# Patient Record
Sex: Female | Born: 1960 | Race: White | Hispanic: No | Smoking: Never smoker
Health system: Southern US, Community
[De-identification: ages and names within clinical notes are randomized; demographics above are authoritative.]

## PROBLEM LIST (undated history)

## (undated) DIAGNOSIS — E559 Vitamin D deficiency, unspecified: Secondary | ICD-10-CM

## (undated) DIAGNOSIS — K8020 Calculus of gallbladder without cholecystitis without obstruction: Secondary | ICD-10-CM

## (undated) DIAGNOSIS — N200 Calculus of kidney: Secondary | ICD-10-CM

## (undated) DIAGNOSIS — E119 Type 2 diabetes mellitus without complications: Secondary | ICD-10-CM

## (undated) DIAGNOSIS — T8859XA Other complications of anesthesia, initial encounter: Secondary | ICD-10-CM

## (undated) DIAGNOSIS — Z9289 Personal history of other medical treatment: Secondary | ICD-10-CM

## (undated) DIAGNOSIS — Z9889 Other specified postprocedural states: Secondary | ICD-10-CM

## (undated) DIAGNOSIS — L732 Hidradenitis suppurativa: Secondary | ICD-10-CM

## (undated) DIAGNOSIS — K766 Portal hypertension: Secondary | ICD-10-CM

## (undated) DIAGNOSIS — Z8619 Personal history of other infectious and parasitic diseases: Secondary | ICD-10-CM

## (undated) DIAGNOSIS — K7460 Unspecified cirrhosis of liver: Secondary | ICD-10-CM

## (undated) DIAGNOSIS — K219 Gastro-esophageal reflux disease without esophagitis: Secondary | ICD-10-CM

## (undated) DIAGNOSIS — G4730 Sleep apnea, unspecified: Secondary | ICD-10-CM

## (undated) DIAGNOSIS — N1832 Chronic kidney disease, stage 3b: Secondary | ICD-10-CM

## (undated) DIAGNOSIS — D649 Anemia, unspecified: Secondary | ICD-10-CM

## (undated) DIAGNOSIS — I700 Atherosclerosis of aorta: Secondary | ICD-10-CM

## (undated) DIAGNOSIS — F32A Depression, unspecified: Secondary | ICD-10-CM

## (undated) DIAGNOSIS — I10 Essential (primary) hypertension: Secondary | ICD-10-CM

## (undated) HISTORY — PX: COLONOSCOPY: SHX174

## (undated) HISTORY — PX: KIDNEY STONE SURGERY: SHX686

## (undated) HISTORY — PX: INGUINAL HIDRADENITIS EXCISION: SHX1827

## (undated) HISTORY — PX: TONSILLECTOMY: SUR1361

## (undated) HISTORY — PX: LAPAROSCOPIC GASTRIC SLEEVE RESECTION: SHX5895

---

## 1999-10-12 ENCOUNTER — Ambulatory Visit (HOSPITAL_COMMUNITY): Admission: RE | Admit: 1999-10-12 | Discharge: 1999-10-13 | Payer: Self-pay | Admitting: Otolaryngology

## 2005-06-29 ENCOUNTER — Emergency Department: Payer: Self-pay | Admitting: Emergency Medicine

## 2006-04-10 ENCOUNTER — Emergency Department: Payer: Self-pay

## 2007-01-25 ENCOUNTER — Ambulatory Visit: Payer: Self-pay | Admitting: Cardiovascular Disease

## 2009-05-20 ENCOUNTER — Ambulatory Visit: Payer: Self-pay | Admitting: Gastroenterology

## 2009-07-10 ENCOUNTER — Ambulatory Visit: Payer: Self-pay | Admitting: Internal Medicine

## 2009-08-18 ENCOUNTER — Ambulatory Visit: Payer: Self-pay | Admitting: Internal Medicine

## 2009-09-03 ENCOUNTER — Ambulatory Visit: Payer: Self-pay | Admitting: Internal Medicine

## 2009-09-21 ENCOUNTER — Ambulatory Visit: Payer: Self-pay | Admitting: Internal Medicine

## 2010-07-29 ENCOUNTER — Encounter: Payer: Self-pay | Admitting: Nurse Practitioner

## 2010-08-05 ENCOUNTER — Ambulatory Visit: Payer: Self-pay | Admitting: Internal Medicine

## 2010-08-11 ENCOUNTER — Ambulatory Visit: Payer: Self-pay | Admitting: Internal Medicine

## 2010-08-22 ENCOUNTER — Ambulatory Visit: Payer: Self-pay | Admitting: Internal Medicine

## 2010-10-26 ENCOUNTER — Ambulatory Visit: Payer: Self-pay | Admitting: Internal Medicine

## 2010-11-05 ENCOUNTER — Encounter: Payer: Self-pay | Admitting: Internal Medicine

## 2010-12-05 ENCOUNTER — Emergency Department: Payer: Self-pay | Admitting: Internal Medicine

## 2010-12-25 ENCOUNTER — Emergency Department: Payer: Self-pay | Admitting: Internal Medicine

## 2011-07-07 ENCOUNTER — Emergency Department: Payer: Self-pay | Admitting: Emergency Medicine

## 2011-07-07 LAB — URINALYSIS, COMPLETE
Glucose,UR: NEGATIVE mg/dL (ref 0–75)
Hyaline Cast: 5
Ph: 5 (ref 4.5–8.0)
WBC UR: 133 /HPF (ref 0–5)

## 2011-07-07 LAB — BASIC METABOLIC PANEL
Anion Gap: 6 — ABNORMAL LOW (ref 7–16)
Creatinine: 1.1 mg/dL (ref 0.60–1.30)
EGFR (Non-African Amer.): 58 — ABNORMAL LOW
Osmolality: 282 (ref 275–301)
Potassium: 4.5 mmol/L (ref 3.5–5.1)

## 2011-07-07 LAB — CBC
HCT: 28.7 % — ABNORMAL LOW (ref 35.0–47.0)
HGB: 8.6 g/dL — ABNORMAL LOW (ref 12.0–16.0)
MCH: 21 pg — ABNORMAL LOW (ref 26.0–34.0)
MCHC: 30 g/dL — ABNORMAL LOW (ref 32.0–36.0)

## 2011-08-05 ENCOUNTER — Ambulatory Visit: Payer: Self-pay | Admitting: Internal Medicine

## 2011-11-11 ENCOUNTER — Ambulatory Visit: Payer: Self-pay | Admitting: Internal Medicine

## 2011-12-15 ENCOUNTER — Ambulatory Visit: Payer: Self-pay | Admitting: Internal Medicine

## 2012-02-22 HISTORY — PX: REDUCTION MAMMAPLASTY: SUR839

## 2013-03-16 ENCOUNTER — Emergency Department: Payer: Self-pay | Admitting: Internal Medicine

## 2013-03-16 LAB — BASIC METABOLIC PANEL
ANION GAP: 4 — AB (ref 7–16)
BUN: 17 mg/dL (ref 7–18)
CO2: 26 mmol/L (ref 21–32)
CREATININE: 0.94 mg/dL (ref 0.60–1.30)
Calcium, Total: 9.2 mg/dL (ref 8.5–10.1)
Chloride: 103 mmol/L (ref 98–107)
EGFR (African American): >60
EGFR (Non-African Amer.): >60
GLUCOSE: 179 mg/dL — AB (ref 65–99)
Osmolality: 272 (ref 275–301)
Potassium: 4.6 mmol/L (ref 3.5–5.1)
SODIUM: 133 mmol/L — AB (ref 136–145)

## 2013-03-16 LAB — CBC
HCT: 38.9 % (ref 35.0–47.0)
HGB: 12.6 g/dL (ref 12.0–16.0)
MCH: 27.1 pg (ref 26.0–34.0)
MCHC: 32.4 g/dL (ref 32.0–36.0)
MCV: 84 fL (ref 80–100)
Platelet: 338 10*3/uL (ref 150–440)
RBC: 4.66 10*6/uL (ref 3.80–5.20)
RDW: 17.4 % — ABNORMAL HIGH (ref 11.5–14.5)
WBC: 9.5 10*3/uL (ref 3.6–11.0)

## 2013-03-16 LAB — TROPONIN I

## 2013-03-19 LAB — WOUND AEROBIC CULTURE

## 2014-07-29 ENCOUNTER — Other Ambulatory Visit: Payer: Self-pay | Admitting: Family Medicine

## 2014-07-29 DIAGNOSIS — Z1231 Encounter for screening mammogram for malignant neoplasm of breast: Secondary | ICD-10-CM

## 2014-08-04 ENCOUNTER — Ambulatory Visit
Admission: RE | Admit: 2014-08-04 | Discharge: 2014-08-04 | Disposition: A | Discharge status: Home / Self Care | Payer: Medicare Other | Source: Ambulatory Visit | Attending: Family Medicine | Admitting: Family Medicine

## 2014-08-04 DIAGNOSIS — R928 Other abnormal and inconclusive findings on diagnostic imaging of breast: Secondary | ICD-10-CM | POA: Insufficient documentation

## 2014-08-04 DIAGNOSIS — Z1231 Encounter for screening mammogram for malignant neoplasm of breast: Secondary | ICD-10-CM | POA: Insufficient documentation

## 2014-08-08 ENCOUNTER — Other Ambulatory Visit: Payer: Self-pay | Admitting: Family Medicine

## 2014-08-08 DIAGNOSIS — R928 Other abnormal and inconclusive findings on diagnostic imaging of breast: Secondary | ICD-10-CM

## 2014-09-23 ENCOUNTER — Other Ambulatory Visit: Payer: Self-pay | Admitting: Oncology

## 2014-09-23 ENCOUNTER — Ambulatory Visit
Admission: RE | Admit: 2014-09-23 | Discharge: 2014-09-23 | Disposition: A | Discharge status: Home / Self Care | Payer: Medicare Other | Source: Ambulatory Visit | Attending: Family Medicine | Admitting: Family Medicine

## 2014-09-23 DIAGNOSIS — N63 Unspecified lump in breast: Secondary | ICD-10-CM | POA: Diagnosis present

## 2014-09-23 DIAGNOSIS — R928 Other abnormal and inconclusive findings on diagnostic imaging of breast: Secondary | ICD-10-CM

## 2014-09-25 ENCOUNTER — Other Ambulatory Visit: Payer: Self-pay | Admitting: Family Medicine

## 2014-09-25 DIAGNOSIS — N63 Unspecified lump in unspecified breast: Secondary | ICD-10-CM

## 2014-09-25 DIAGNOSIS — R928 Other abnormal and inconclusive findings on diagnostic imaging of breast: Secondary | ICD-10-CM

## 2014-10-01 ENCOUNTER — Ambulatory Visit
Admission: RE | Admit: 2014-10-01 | Discharge: 2014-10-01 | Disposition: A | Discharge status: Home / Self Care | Payer: Medicare Other | Source: Ambulatory Visit | Attending: Family Medicine | Admitting: Family Medicine

## 2014-10-01 DIAGNOSIS — R928 Other abnormal and inconclusive findings on diagnostic imaging of breast: Secondary | ICD-10-CM | POA: Diagnosis present

## 2014-10-01 DIAGNOSIS — Z9889 Other specified postprocedural states: Secondary | ICD-10-CM | POA: Insufficient documentation

## 2014-10-01 DIAGNOSIS — N63 Unspecified lump in unspecified breast: Secondary | ICD-10-CM

## 2014-10-01 HISTORY — PX: BREAST BIOPSY: SHX20

## 2014-10-02 LAB — SURGICAL PATHOLOGY

## 2015-06-26 ENCOUNTER — Other Ambulatory Visit: Payer: Self-pay | Admitting: Surgical Oncology

## 2015-06-26 DIAGNOSIS — G4733 Obstructive sleep apnea (adult) (pediatric): Secondary | ICD-10-CM

## 2015-06-26 DIAGNOSIS — I10 Essential (primary) hypertension: Secondary | ICD-10-CM

## 2015-06-26 DIAGNOSIS — K219 Gastro-esophageal reflux disease without esophagitis: Secondary | ICD-10-CM

## 2015-07-07 ENCOUNTER — Other Ambulatory Visit: Payer: Medicare Other

## 2015-07-08 ENCOUNTER — Other Ambulatory Visit: Payer: Self-pay | Admitting: Surgical Oncology

## 2015-07-08 ENCOUNTER — Ambulatory Visit
Admission: RE | Admit: 2015-07-08 | Discharge: 2015-07-08 | Disposition: A | Discharge status: Home / Self Care | Payer: Medicare HMO | Source: Ambulatory Visit | Attending: Surgical Oncology | Admitting: Surgical Oncology

## 2015-07-08 DIAGNOSIS — I10 Essential (primary) hypertension: Secondary | ICD-10-CM

## 2015-07-08 DIAGNOSIS — K219 Gastro-esophageal reflux disease without esophagitis: Secondary | ICD-10-CM

## 2015-07-08 DIAGNOSIS — G4733 Obstructive sleep apnea (adult) (pediatric): Secondary | ICD-10-CM

## 2015-11-13 ENCOUNTER — Other Ambulatory Visit: Payer: Self-pay | Admitting: Family Medicine

## 2015-11-13 DIAGNOSIS — R928 Other abnormal and inconclusive findings on diagnostic imaging of breast: Secondary | ICD-10-CM

## 2015-12-04 ENCOUNTER — Ambulatory Visit
Admission: RE | Admit: 2015-12-04 | Discharge: 2015-12-04 | Disposition: A | Discharge status: Home / Self Care | Payer: Medicare HMO | Source: Ambulatory Visit | Attending: Family Medicine | Admitting: Family Medicine

## 2015-12-04 DIAGNOSIS — R928 Other abnormal and inconclusive findings on diagnostic imaging of breast: Secondary | ICD-10-CM

## 2017-04-29 ENCOUNTER — Emergency Department
Admission: EM | Admit: 2017-04-29 | Discharge: 2017-04-29 | Disposition: A | Discharge status: Home / Self Care | Payer: Medicare HMO | Attending: Emergency Medicine | Admitting: Emergency Medicine

## 2017-04-29 ENCOUNTER — Emergency Department: Payer: Medicare HMO

## 2017-04-29 ENCOUNTER — Encounter: Payer: Self-pay | Admitting: Emergency Medicine

## 2017-04-29 ENCOUNTER — Other Ambulatory Visit: Payer: Self-pay

## 2017-04-29 DIAGNOSIS — M84475A Pathological fracture, left foot, initial encounter for fracture: Secondary | ICD-10-CM | POA: Insufficient documentation

## 2017-04-29 DIAGNOSIS — W010XXA Fall on same level from slipping, tripping and stumbling without subsequent striking against object, initial encounter: Secondary | ICD-10-CM | POA: Diagnosis not present

## 2017-04-29 DIAGNOSIS — Y999 Unspecified external cause status: Secondary | ICD-10-CM | POA: Insufficient documentation

## 2017-04-29 DIAGNOSIS — Y9389 Activity, other specified: Secondary | ICD-10-CM | POA: Diagnosis not present

## 2017-04-29 DIAGNOSIS — Y929 Unspecified place or not applicable: Secondary | ICD-10-CM | POA: Insufficient documentation

## 2017-04-29 DIAGNOSIS — Z7982 Long term (current) use of aspirin: Secondary | ICD-10-CM | POA: Diagnosis not present

## 2017-04-29 DIAGNOSIS — Z79899 Other long term (current) drug therapy: Secondary | ICD-10-CM | POA: Insufficient documentation

## 2017-04-29 DIAGNOSIS — S99922A Unspecified injury of left foot, initial encounter: Secondary | ICD-10-CM | POA: Diagnosis present

## 2017-04-29 HISTORY — DX: Hidradenitis suppurativa: L73.2

## 2017-04-29 MED ORDER — OXYCODONE-ACETAMINOPHEN 10-325 MG PO TABS: 1.0000 | ORAL_TABLET | ORAL | 0 refills | Status: DC | PRN

## 2017-04-29 NOTE — ED Triage Notes (Signed)
States tripped over dog one hour ago. Pain L foot at onset, now states starting to hurt in knee.

## 2017-04-29 NOTE — Discharge Instructions (Signed)
Follow up with DR Cline's office.  Call on Monday morning for an appointment.  Do not bear weight on your foot.  Keep it elevated and iced as much as possible.

## 2017-04-29 NOTE — ED Notes (Signed)
See triage note  Presents with pain to left foot/ankle  States her dog went between her legs and she twisted left foot/ankle  No deformity noted

## 2017-04-29 NOTE — ED Provider Notes (Signed)
[facility] Emergency Department Provider Note  ____________________________________________   First MD Initiated Contact with Patient 04/29/17 1145     (approximate)  I have reviewed the triage vital signs and the nursing notes.   HISTORY  Chief Complaint Foot Injury    HPI Olivia Schroeder is a 57 y.o. female reports to the emergency department complaining of left foot pain.  She states her dog went between her legs and caused her to fall against a door twisting her left foot and ankle.  She states it hurts to bear weight.  Onset was this morning.  She denies any other injuries.  She states her leg is hurting a little bit  Past Medical History:  Diagnosis Date  . Hidradenitis     There are no active problems to display for this patient.   Past Surgical History:  Procedure Laterality Date  . BREAST BIOPSY Left 10/01/2014   neg  . LAPAROSCOPIC GASTRIC SLEEVE RESECTION    . REDUCTION MAMMAPLASTY Bilateral 2014    Prior to Admission medications   Medication Sig Start Date End Date Taking? Authorizing Provider  aspirin 81 MG chewable tablet Chew 81 mg by mouth daily.   Yes [provider]  atorvastatin (LIPITOR) 40 MG tablet Take 40 mg by mouth daily.   Yes [provider]  oxyCODONE-acetaminophen (PERCOCET) 10-325 MG tablet Take 1 tablet by mouth every 4 (four) hours as needed for pain. 04/29/17   Faythe Ghee, PA-C    Allergies Penicillins and Sulfa antibiotics  Family History  Problem Relation Age of Onset  . Cancer Paternal Grandfather        unsure of what type    Social History Social History   Tobacco Use  . Smoking status: Not on file  Substance Use Topics  . Alcohol use: Not on file  . Drug use: Not on file    Review of Systems  Constitutional: No fever/chills Eyes: No visual changes. ENT: No sore throat. Respiratory: Denies cough Genitourinary: Negative for dysuria. Musculoskeletal: Negative for  back pain.  Positive for left foot pain Skin: Negative for rash.    ____________________________________________   PHYSICAL EXAM:  VITAL SIGNS: ED Triage Vitals  Enc Vitals Group     BP 04/29/17 1049 (!) 166/72     Pulse Rate 04/29/17 1049 77     Resp 04/29/17 1049 20     Temp 04/29/17 1049 97.7 F (36.5 C)     Temp Source 04/29/17 1049 Oral     SpO2 04/29/17 1049 99 %     Weight 04/29/17 1051 235 lb (106.6 kg)     Height 04/29/17 1051 5\' 4"  (1.626 m)     Head Circumference --      Peak Flow --      Pain Score 04/29/17 1051 10     Pain Loc --      Pain Edu? --      Excl. in GC? --     Constitutional: Alert and oriented. Well appearing and in no acute distress. Eyes: Conjunctivae are normal.  Head: Atraumatic. Nose: No congestion/rhinnorhea. Mouth/Throat: Mucous membranes are moist.   Cardiovascular: Normal rate, regular rhythm. Respiratory: Normal respiratory effort.  No retractions GU: deferred Musculoskeletal: FROM all extremities, warm and well perfused, left foot is tender at the proximal aspect of the metatarsals, there is no bruising noted, neurovascular is intact Neurologic:  Normal speech and language.  Skin:  Skin is warm, dry and intact. No rash  noted. Psychiatric: Mood and affect are normal. Speech and behavior are normal.  ____________________________________________   LABS (all labs ordered are listed, but only abnormal results are displayed)  Labs Reviewed - No data to display ____________________________________________   ____________________________________________  RADIOLOGY  X-ray of the left foot was read as negative by the radiologist, however, there is a fracture at the proximal second metatarsal  ____________________________________________   PROCEDURES  Procedure(s) performed:   .Splint Application Date/Time: 04/29/2017 12:07 PM Performed by: Jacqlyn Larsenhomas, Jacob M, NT Authorized by: Faythe GheeFisher, Skarleth Delmonico W, PA-C   Consent:    Consent  obtained:  Verbal   Consent given by:  Patient   Risks discussed:  Discoloration, numbness, pain and swelling   Alternatives discussed:  No treatment Pre-procedure details:    Sensation:  Normal Procedure details:    Laterality:  Left   Location:  Foot   Foot:  L foot   Strapping: no     Splint type:  Short leg   Supplies:  Ortho-Glass Post-procedure details:    Pain:  Improved   Sensation:  Normal   Patient tolerance of procedure:  Tolerated well, no immediate complications Comments:     Patient is neurovascularly intact post splint application      ____________________________________________   INITIAL IMPRESSION / ASSESSMENT AND PLAN / ED COURSE  Pertinent labs & imaging results that were available during my care of the patient were reviewed by me and considered in my medical decision making (see chart for details).  Patient is a 57 year old female complaining of left foot pain after twisting it earlier today.  She points to the proximal metatarsals as location for the pain  On physical exam the proximal metatarsals are tender to palpation, the lateral malleolus is tender to palpation  X-ray of the left foot was read as negative by the radiologist.  On my review of the x-ray there is a second metatarsal fracture approximately  Paged Dr. Alberteen Spindleline  Dr Alberteen Spindleline did not return our call.  Explained to the patient that she is to call the office on Monday for an appointment.  Recommended she stop the ASA qd in case he would need to put a pin in the bone.  She is to remain non weight bearing.  She states she understands.  She was given a rx for percocet for pain.  She was discharged in stable condition     As part of my medical decision making, I reviewed the following data within the electronic MEDICAL RECORD NUMBER Nursing notes reviewed and incorporated, Radiograph reviewed left foot x-ray shows a fracture of the proximal second metatarsal, Notes from prior ED visits and [place] Controlled  Substance Database  ____________________________________________   FINAL CLINICAL IMPRESSION(S) / ED DIAGNOSES  Final diagnoses:  Metatarsal fracture, pathologic, left, initial encounter      NEW MEDICATIONS STARTED DURING THIS VISIT:  New Prescriptions   OXYCODONE-ACETAMINOPHEN (PERCOCET) 10-325 MG TABLET    Take 1 tablet by mouth every 4 (four) hours as needed for pain.     Note:  This document was prepared using Dragon voice recognition software and may include unintentional dictation errors.    Faythe GheeFisher, Jozef Eisenbeis W, PA-C 04/29/17 1702    Sharyn CreamerQuale, Mark, MD 05/03/17 2227

## 2018-10-25 ENCOUNTER — Other Ambulatory Visit: Payer: Self-pay

## 2018-10-25 DIAGNOSIS — Z20822 Contact with and (suspected) exposure to covid-19: Secondary | ICD-10-CM

## 2018-10-26 LAB — NOVEL CORONAVIRUS, NAA: SARS-CoV-2, NAA: DETECTED — AB

## 2020-09-27 ENCOUNTER — Emergency Department
Admission: EM | Admit: 2020-09-27 | Discharge: 2020-09-28 | Disposition: A | Discharge status: Home / Self Care | Payer: Medicare HMO | Attending: Emergency Medicine | Admitting: Emergency Medicine

## 2020-09-27 ENCOUNTER — Other Ambulatory Visit: Payer: Self-pay

## 2020-09-27 ENCOUNTER — Encounter: Payer: Self-pay | Admitting: Emergency Medicine

## 2020-09-27 DIAGNOSIS — J069 Acute upper respiratory infection, unspecified: Secondary | ICD-10-CM | POA: Insufficient documentation

## 2020-09-27 DIAGNOSIS — R030 Elevated blood-pressure reading, without diagnosis of hypertension: Secondary | ICD-10-CM | POA: Diagnosis present

## 2020-09-27 DIAGNOSIS — I10 Essential (primary) hypertension: Secondary | ICD-10-CM | POA: Insufficient documentation

## 2020-09-27 DIAGNOSIS — Z20822 Contact with and (suspected) exposure to covid-19: Secondary | ICD-10-CM | POA: Insufficient documentation

## 2020-09-27 DIAGNOSIS — Z7982 Long term (current) use of aspirin: Secondary | ICD-10-CM | POA: Diagnosis not present

## 2020-09-27 DIAGNOSIS — Z79899 Other long term (current) drug therapy: Secondary | ICD-10-CM | POA: Diagnosis not present

## 2020-09-27 DIAGNOSIS — E119 Type 2 diabetes mellitus without complications: Secondary | ICD-10-CM | POA: Diagnosis not present

## 2020-09-27 HISTORY — DX: Depression, unspecified: F32.A

## 2020-09-27 HISTORY — DX: Type 2 diabetes mellitus without complications: E11.9

## 2020-09-27 HISTORY — DX: Essential (primary) hypertension: I10

## 2020-09-27 LAB — CBC WITH DIFFERENTIAL/PLATELET
Abs Immature Granulocytes: 0.01 10*3/uL (ref 0.00–0.07)
Basophils Absolute: 0 10*3/uL (ref 0.0–0.1)
Basophils Relative: 1 %
Eosinophils Absolute: 0.2 10*3/uL (ref 0.0–0.5)
Eosinophils Relative: 5 %
HCT: 40.4 % (ref 36.0–46.0)
Hemoglobin: 13.3 g/dL (ref 12.0–15.0)
Immature Granulocytes: 0 %
Lymphocytes Relative: 31 %
Lymphs Abs: 1.5 10*3/uL (ref 0.7–4.0)
MCH: 31.3 pg (ref 26.0–34.0)
MCHC: 32.9 g/dL (ref 30.0–36.0)
MCV: 95.1 fL (ref 80.0–100.0)
Monocytes Absolute: 0.4 10*3/uL (ref 0.1–1.0)
Monocytes Relative: 9 %
Neutro Abs: 2.7 10*3/uL (ref 1.7–7.7)
Neutrophils Relative %: 54 %
Platelets: 197 10*3/uL (ref 150–400)
RBC: 4.25 MIL/uL (ref 3.87–5.11)
RDW: 13.5 % (ref 11.5–15.5)
WBC: 5 10*3/uL (ref 4.0–10.5)
nRBC: 0 % (ref 0.0–0.2)

## 2020-09-27 LAB — BASIC METABOLIC PANEL
Anion gap: 6 (ref 5–15)
BUN: 18 mg/dL (ref 6–20)
CO2: 24 mmol/L (ref 22–32)
Calcium: 9.1 mg/dL (ref 8.9–10.3)
Chloride: 109 mmol/L (ref 98–111)
Creatinine, Ser: 0.83 mg/dL (ref 0.44–1.00)
GFR, Estimated: >60 mL/min (ref >60)
Glucose, Bld: 141 mg/dL — ABNORMAL HIGH (ref 70–99)
Potassium: 3.8 mmol/L (ref 3.5–5.1)
Sodium: 139 mmol/L (ref 135–145)

## 2020-09-27 MED ORDER — ACETAMINOPHEN 325 MG PO TABS
650.0000 mg | ORAL_TABLET | Freq: Once | ORAL | Status: AC
  Administered 2020-09-27: 650 mg via ORAL
  Filled 2020-09-27: qty 2

## 2020-09-27 NOTE — ED Triage Notes (Signed)
Pt to ED via POV, pt states that her blood pressure has been going up and down for a while. Pt states that her PCP told her to come to the ED the next time her blood pressure was up. 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 Pt states that she has not missed any of her blood pressure medications. States that she does have a slight headache but that she has sinus congestion as well. Pt is in NAD.

## 2020-09-27 NOTE — ED Notes (Signed)
Pt complaining of a headache. Verbal order given to give tylenol

## 2020-09-28 LAB — SARS CORONAVIRUS 2 (TAT 6-24 HRS): SARS Coronavirus 2: NEGATIVE

## 2020-09-28 MED ORDER — HYDROCOD POLST-CPM POLST ER 10-8 MG/5ML PO SUER: 5.0000 mL | Freq: Two times a day (BID) | ORAL | 0 refills | Status: DC | PRN

## 2020-09-28 NOTE — ED Notes (Signed)
MD at the bedside  

## 2020-09-28 NOTE — Discharge Instructions (Addendum)
As we discussed, though you do have high blood pressure (hypertension), fortunately it is not immediately dangerous at this time and does not need emergency intervention or admission to the hospital.  If we add to or change your regular medications, we may cause more harm than good - it is more appropriate for your primary care doctor to evaluate you in clinic and decide if any medication changes are needed.  Please follow up in clinic as recommended in these papers.    Please remember to check MyChart tomorrow for the results of your COVID-19 test as well.  Return to the Emergency Department (ED) if you experience any worsening chest pain/pressure/tightness, difficulty breathing, or sudden sweating, or other symptoms that concern you.

## 2020-09-28 NOTE — ED Provider Notes (Signed)
[facility] Emergency Department Provider Note  ____________________________________________   Event Date/Time   First MD Initiated Contact with Patient 09/27/20 2339     (approximate)  I have reviewed the triage vital signs and the nursing notes.   HISTORY  Chief Complaint Hypertension    HPI Olivia Schroeder is a 60 y.o. female with medical history as listed below who presents with concerns about high blood pressure.  She said that she has been working with her primary care doctor for a few weeks because her blood pressure is extremely variable.  She thinks that sometimes she gets a headache when her blood pressure is up.  However she said that tonight her blood pressure was up consistently as high as 220 systolic so she did what her PCP told her and came to the emergency department.  She did not develop a headache until she came to the ED but now it has gone away after taking some Tylenol while waiting to be seen.  She denies chest pain, shortness of breath, nausea, vomiting, and abdominal pain.  She had acute onset of upper respiratory symptoms including nasal congestion, runny nose, some sinus pressure, and a mild cough.  This all started over the last 24 hours.  She is vaccinated and boosted for COVID-19.  She describes the blood pressure is severe at times but nothing particular makes it better or worse and she is relatively asymptomatic.     Past Medical History:  Diagnosis Date   Depression    Diabetes mellitus without complication (HCC)    Hidradenitis    Hypertension     There are no problems to display for this patient.   Past Surgical History:  Procedure Laterality Date   BREAST BIOPSY Left 10/01/2014   neg   LAPAROSCOPIC GASTRIC SLEEVE RESECTION     REDUCTION MAMMAPLASTY Bilateral 2014    Prior to Admission medications   Medication Sig Start Date End Date Taking? Authorizing Provider  chlorpheniramine-HYDROcodone (TUSSIONEX  PENNKINETIC ER) 10-8 MG/5ML SUER Take 5 mLs by mouth every 12 (twelve) hours as needed for cough. 09/28/20  Yes Loleta Rose, MD  aspirin 81 MG chewable tablet Chew 81 mg by mouth daily.    [provider]  atorvastatin (LIPITOR) 40 MG tablet Take 40 mg by mouth daily.    [provider]  oxyCODONE-acetaminophen (PERCOCET) 10-325 MG tablet Take 1 tablet by mouth every 4 (four) hours as needed for pain. 04/29/17   Faythe Ghee, PA-C    Allergies Penicillins and Sulfa antibiotics  Family History  Problem Relation Age of Onset   Cancer Paternal Grandfather        unsure of what type    Social History    Review of Systems Constitutional: No fever/chills Eyes: No visual changes. ENT: No sore throat. Cardiovascular: Denies chest pain. Respiratory: Denies shortness of breath. Gastrointestinal: No abdominal pain.  No nausea, no vomiting.  No diarrhea.  No constipation. Genitourinary: Negative for dysuria. Musculoskeletal: Negative for neck pain.  Negative for back pain. Integumentary: Negative for rash. Neurological: Occasional intermittent headache but resolved after Tylenol.  No focal weakness or numbness.   ____________________________________________   PHYSICAL EXAM:  VITAL SIGNS: ED Triage Vitals  Enc Vitals Group     BP 09/27/20 1715 (!) 156/106     Pulse Rate 09/27/20 1715 80     Resp 09/27/20 1715 16     Temp 09/27/20 1722 99.1 F (37.3 C)     Temp Source  09/27/20 1715 Oral     SpO2 09/27/20 1715 95 %     Weight 09/27/20 1716 136.1 kg (300 lb)     Height 09/27/20 1716 1.626 m (5\' 4" )     Head Circumference --      Peak Flow --      Pain Score 09/27/20 1716 8     Pain Loc --      Pain Edu? --      Excl. in GC? --     Constitutional: Alert and oriented.  Eyes: Conjunctivae are normal.  Head: Atraumatic. Nose: No congestion/rhinnorhea. Mouth/Throat: Patient is wearing a mask. Neck: No stridor.  No meningeal signs.   Cardiovascular: Normal  rate, regular rhythm. Good peripheral circulation. Respiratory: Normal respiratory effort.  No retractions.  Lungs are clear to auscultation bilaterally. Gastrointestinal: Soft and nontender. No distention.  Musculoskeletal: No lower extremity tenderness nor edema. No gross deformities of extremities. Neurologic:  Normal speech and language. No gross focal neurologic deficits are appreciated.  Skin:  Skin is warm, dry and intact. Psychiatric: Mood and affect are normal. Speech and behavior are normal.  ____________________________________________   LABS (all labs ordered are listed, but only abnormal results are displayed)  Labs Reviewed  BASIC METABOLIC PANEL - Abnormal; Notable for the following components:      Result Value   Glucose, Bld 141 (*)    All other components within normal limits  SARS CORONAVIRUS 2 (TAT 6-24 HRS)  CBC WITH DIFFERENTIAL/PLATELET   ____________________________________________  EKG  ED ECG REPORT I, 11/27/20, the attending physician, personally viewed and interpreted this ECG.  Date: 09/27/2020 EKG Time: 17:27 Rate: 80 Rhythm: sinus rhythm with 1st degree AV block QRS Axis: normal Intervals: normal ST/T Wave abnormalities: normal Narrative Interpretation: no evidence of acute ischemia  ____________________________________________   INITIAL IMPRESSION / MDM / ASSESSMENT AND PLAN / ED COURSE  As part of my medical decision making, I reviewed the following data within the electronic MEDICAL RECORD NUMBER Nursing notes reviewed and incorporated, Labs reviewed , EKG interpreted , Old chart reviewed, Notes from prior ED visits, and [place] Controlled Substance Database   Differential diagnosis includes, but is not limited to, primary hypertension, COVID-19, hypertensive emergency.  Patient's vital signs are reassuring with a blood pressure down to 149/67 without intervention.  Her PCP recently went up on her losartan to 100 mg daily and she also takes  HCTZ.  She is having no difficulty breathing, occasional mild cough but no respiratory distress.  Lungs are clear to auscultation.  Basic metabolic panel is normal with no evidence of renal dysfunction.  CBC is normal.  Patient is comfortable with plan to go home and I had my usual primary hypertension discussion with the patient.  She is very comfortable following up with her primary care doctor.  For her upper respiratory symptoms, we agreed to do a COVID test and she knows to check MyChart for the results tomorrow.  Prescription as listed below.  No indication for further intervention in this regard.  I gave my usual customary follow-up recommendations and return precautions.           ____________________________________________  FINAL CLINICAL IMPRESSION(S) / ED DIAGNOSES  Final diagnoses:  Primary hypertension  Viral upper respiratory tract infection with cough     MEDICATIONS GIVEN DURING THIS VISIT:  Medications  acetaminophen (TYLENOL) tablet 650 mg (650 mg Oral Given 09/27/20 1956)     ED Discharge Orders  Ordered    chlorpheniramine-HYDROcodone (TUSSIONEX PENNKINETIC ER) 10-8 MG/5ML SUER  Every 12 hours PRN        09/28/20 0023             Note:  This document was prepared using Dragon voice recognition software and may include unintentional dictation errors.   Loleta Rose, MD 09/28/20 [id number]

## 2021-02-18 ENCOUNTER — Ambulatory Visit: Payer: Self-pay

## 2021-02-21 DIAGNOSIS — I3139 Other pericardial effusion (noninflammatory): Secondary | ICD-10-CM

## 2021-02-21 DIAGNOSIS — U071 COVID-19: Secondary | ICD-10-CM

## 2021-02-21 HISTORY — DX: Other pericardial effusion (noninflammatory): I31.39

## 2021-02-21 HISTORY — DX: COVID-19: U07.1

## 2021-04-16 ENCOUNTER — Emergency Department: Payer: Medicare HMO

## 2021-04-16 ENCOUNTER — Other Ambulatory Visit: Payer: Self-pay

## 2021-04-16 ENCOUNTER — Encounter: Payer: Self-pay | Admitting: Intensive Care

## 2021-04-16 ENCOUNTER — Emergency Department
Admission: EM | Admit: 2021-04-16 | Discharge: 2021-04-16 | Disposition: A | Discharge status: Home / Self Care | Payer: Medicare HMO | Attending: Emergency Medicine | Admitting: Emergency Medicine

## 2021-04-16 DIAGNOSIS — M25562 Pain in left knee: Secondary | ICD-10-CM | POA: Diagnosis present

## 2021-04-16 DIAGNOSIS — E1165 Type 2 diabetes mellitus with hyperglycemia: Secondary | ICD-10-CM | POA: Insufficient documentation

## 2021-04-16 DIAGNOSIS — I10 Essential (primary) hypertension: Secondary | ICD-10-CM | POA: Diagnosis not present

## 2021-04-16 HISTORY — DX: Unspecified cirrhosis of liver: K74.60

## 2021-04-16 LAB — BASIC METABOLIC PANEL
Anion gap: 10 (ref 5–15)
BUN: 15 mg/dL (ref 6–20)
CO2: 25 mmol/L (ref 22–32)
Calcium: 9.2 mg/dL (ref 8.9–10.3)
Chloride: 101 mmol/L (ref 98–111)
Creatinine, Ser: 0.85 mg/dL (ref 0.44–1.00)
GFR, Estimated: >60 mL/min (ref >60)
Glucose, Bld: 229 mg/dL — ABNORMAL HIGH (ref 70–99)
Potassium: 4.2 mmol/L (ref 3.5–5.1)
Sodium: 136 mmol/L (ref 135–145)

## 2021-04-16 LAB — SEDIMENTATION RATE: Sed Rate: 27 mm/hr (ref 0–30)

## 2021-04-16 LAB — CBC
HCT: 43.7 % (ref 36.0–46.0)
Hemoglobin: 14 g/dL (ref 12.0–15.0)
MCH: 30 pg (ref 26.0–34.0)
MCHC: 32 g/dL (ref 30.0–36.0)
MCV: 93.6 fL (ref 80.0–100.0)
Platelets: 210 10*3/uL (ref 150–400)
RBC: 4.67 MIL/uL (ref 3.87–5.11)
RDW: 13.3 % (ref 11.5–15.5)
WBC: 8.2 10*3/uL (ref 4.0–10.5)
nRBC: 0 % (ref 0.0–0.2)

## 2021-04-16 MED ORDER — HYDROCODONE-ACETAMINOPHEN 5-325 MG PO TABS
1.0000 | ORAL_TABLET | Freq: Once | ORAL | Status: AC
  Administered 2021-04-16: 1 via ORAL
  Filled 2021-04-16: qty 1

## 2021-04-16 MED ORDER — ONDANSETRON 4 MG PO TBDP
4.0000 mg | ORAL_TABLET | Freq: Once | ORAL | Status: AC
  Administered 2021-04-16: 4 mg via ORAL
  Filled 2021-04-16: qty 1

## 2021-04-16 MED ORDER — HYDROCODONE-ACETAMINOPHEN 5-325 MG PO TABS: 1.0000 | ORAL_TABLET | Freq: Four times a day (QID) | ORAL | 0 refills | Status: AC | PRN

## 2021-04-16 NOTE — Discharge Instructions (Addendum)
-  Take ibuprofen/naproxen as needed for pain.  Use hydrocodone sparingly. -Follow-up with the orthopedist listed above if your symptoms fail to improve after 2 to 3 weeks. -Return to the emergency department anytime if you begin to experience any new or worsening symptoms.

## 2021-04-16 NOTE — ED Provider Notes (Signed)
[facility] Provider Note    Event Date/Time   First MD Initiated Contact with Patient 04/16/21 1638     (approximate)   History   Chief Complaint Knee Pain   HPI Olivia Schroeder is a 61 y.o. female, history of diabetes, hypertension, depression, presents to the emergency department for evaluation of knee pain.  Patient states that the pain started approximately 3 days ago.  Denies any recent illnesses or injuries.  She states that is progressively gotten worse.  She states that she can still currently walk on it with minimal difficulty, however she is concerned that it could be some serious.  Denies fever/chills, eye pain, lower leg pain, foot pain, hip pain numbness/tingliness in lower extremities, cough, congestion, chest pain, shortness of breath.  History Limitations: No limitations.      Physical Exam  Triage Vital Signs: ED Triage Vitals  Enc Vitals Group     BP 04/16/21 1614 (!) 156/91     Pulse Rate 04/16/21 1614 83     Resp 04/16/21 1614 20     Temp 04/16/21 1614 99.3 F (37.4 C)     Temp Source 04/16/21 1614 Oral     SpO2 04/16/21 1614 94 %     Weight 04/16/21 1615 300 lb (136.1 kg)     Height 04/16/21 1615 5\' 4"  (1.626 m)     Head Circumference --      Peak Flow --      Pain Score 04/16/21 1614 10     Pain Loc --      Pain Edu? --      Excl. in [place]? --     Most recent vital signs: Vitals:   04/16/21 1614  BP: (!) 156/91  Pulse: 83  Resp: 20  Temp: 99.3 F (37.4 C)  SpO2: 94%    General: Awake, NAD.  CV: Good peripheral perfusion.  Resp: Normal effort.  Abd: Soft, non-tender. No distention.  Neuro: At baseline. No gross neurological deficits. Other: No gross deformities.  No significant swelling.  No warmth or erythema.  Tenderness along the patellar tendon and lateral aspect of the knee.  Pulse, motor, sensation intact.  Patient is able to ambulate without assistance.  Negative anterior drawer.  Mild pain with varus  maneuvering.   Physical Exam    ED Results / Procedures / Treatments  Labs (all labs ordered are listed, but only abnormal results are displayed) Labs Reviewed  BASIC METABOLIC PANEL - Abnormal; Notable for the following components:      Result Value   Glucose, Bld 229 (*)    All other components within normal limits  SEDIMENTATION RATE  CBC     EKG Not applicable.   RADIOLOGY  ED Provider Interpretation: I personally reviewed and interpreted this image, no acute findings on x-ray  DG Knee Complete 4 Views Left  Result Date: 04/16/2021 CLINICAL DATA:  Left knee pain EXAM: LEFT KNEE - COMPLETE 4+ VIEW COMPARISON:  06/29/2005 FINDINGS: No evidence of fracture, dislocation, or joint effusion. No evidence of arthropathy or other focal bone abnormality. Small bidirectional patellar enthesophytes. Generalized prominence of the soft tissues. IMPRESSION: Negative. Electronically Signed   By: Davina Poke D.O.   On: 04/16/2021 16:55    PROCEDURES:  Critical Care performed: None.  Procedures    MEDICATIONS ORDERED IN ED: Medications  HYDROcodone-acetaminophen (NORCO/VICODIN) 5-325 MG per tablet 1 tablet (1 tablet Oral Given 04/16/21 1729)  ondansetron (ZOFRAN-ODT) disintegrating tablet 4 mg (4 mg  Oral Given 04/16/21 1742)     IMPRESSION / MDM / ASSESSMENT AND PLAN / ED COURSE  I reviewed the triage vital signs and the nursing notes.                              Olivia Schroeder is a 61 y.o. female, history of diabetes, hypertension, depression, presents to the emergency department for evaluation of knee pain.  Patient states that the pain started approximately 3 days ago.  Denies any recent illnesses or injuries.  She states that is progressively gotten worse.  She states that she can still currently walk on it with minimal difficulty, however she is concerned that it could be some serious.  Differential diagnosis includes, but is not limited to, knee sprain, patellar  tendinitis, ACL/PCL tear, MCL sprain/tear, bursitis, tibial plateau fracture, fibular head fracture, septic arthritis, osteoarthritis.  ED Course Patient appears well.  Vital signs within normal limits for the patient.  NAD  CBC unremarkable for leukocytosis or anemia.  Sedimentation rate unremarkable at 27.  Unlikely septic arthritis.  BMP notable for hyperglycemia at 229, otherwise no electrolyte abnormalities or evidence of kidney injury.  Patient has a history of diabetes and states that she has a appointment with her endocrinologist next week.  Assessment/Plan Given the patient's history, physical exam, and work-up thus far, I do not suspect any serious or life-threatening pathology.  Signs and symptoms are consistent knee sprain versus patellar tendinitis versus MCL sprain.  Low suspicion for septic arthritis.  We will plan to discharge this patient with a short supply of hydrocodone.  Advised her to follow-up with an orthopedist if symptoms fail to improve after a few weeks.  We will discharge this patient.  Patient was provided with anticipatory guidance, return precautions, and educational material. Encouraged the patient to return to the emergency department at any time if they begin to experience any new or worsening symptoms.       FINAL CLINICAL IMPRESSION(S) / ED DIAGNOSES   Final diagnoses:  Acute pain of left knee     Rx / DC Orders   ED Discharge Orders          Ordered    HYDROcodone-acetaminophen (NORCO/VICODIN) 5-325 MG tablet  Every 6 hours PRN        04/16/21 1841             Note:  This document was prepared using Dragon voice recognition software and may include unintentional dictation errors.   Teodoro Spray, [place] 04/16/21 1949    Lucrezia Starch, MD 04/16/21 2139

## 2021-04-16 NOTE — ED Triage Notes (Signed)
Patient c/o left knee pain that started a few days ago and has progressively gotten worse. Reports tender to touch. Denies injury

## 2021-04-20 ENCOUNTER — Ambulatory Visit
Admission: RE | Admit: 2021-04-20 | Discharge: 2021-04-20 | Disposition: A | Discharge status: Home / Self Care | Payer: Medicare HMO | Source: Ambulatory Visit | Attending: Family Medicine | Admitting: Family Medicine

## 2021-04-20 ENCOUNTER — Other Ambulatory Visit: Payer: Self-pay

## 2021-04-20 ENCOUNTER — Other Ambulatory Visit: Payer: Self-pay | Admitting: Family Medicine

## 2021-04-20 DIAGNOSIS — Z1231 Encounter for screening mammogram for malignant neoplasm of breast: Secondary | ICD-10-CM

## 2021-06-19 ENCOUNTER — Emergency Department
Admission: EM | Admit: 2021-06-19 | Discharge: 2021-06-19 | Disposition: A | Discharge status: Home / Self Care | Payer: Medicare HMO | Attending: Emergency Medicine | Admitting: Emergency Medicine

## 2021-06-19 ENCOUNTER — Encounter: Payer: Self-pay | Admitting: Emergency Medicine

## 2021-06-19 ENCOUNTER — Emergency Department: Payer: Medicare HMO

## 2021-06-19 ENCOUNTER — Other Ambulatory Visit: Payer: Self-pay

## 2021-06-19 DIAGNOSIS — L03317 Cellulitis of buttock: Secondary | ICD-10-CM | POA: Insufficient documentation

## 2021-06-19 DIAGNOSIS — E119 Type 2 diabetes mellitus without complications: Secondary | ICD-10-CM | POA: Diagnosis not present

## 2021-06-19 DIAGNOSIS — R21 Rash and other nonspecific skin eruption: Secondary | ICD-10-CM | POA: Insufficient documentation

## 2021-06-19 DIAGNOSIS — I10 Essential (primary) hypertension: Secondary | ICD-10-CM | POA: Diagnosis not present

## 2021-06-19 DIAGNOSIS — R509 Fever, unspecified: Secondary | ICD-10-CM | POA: Diagnosis present

## 2021-06-19 LAB — URINALYSIS, ROUTINE W REFLEX MICROSCOPIC
Bilirubin Urine: NEGATIVE
Glucose, UA: NEGATIVE mg/dL
Hgb urine dipstick: NEGATIVE
Ketones, ur: 20 mg/dL — AB
Nitrite: NEGATIVE
Protein, ur: NEGATIVE mg/dL
Specific Gravity, Urine: 1.017 (ref 1.005–1.030)
pH: 5 (ref 5.0–8.0)

## 2021-06-19 LAB — CBC WITH DIFFERENTIAL/PLATELET
Abs Immature Granulocytes: 0.03 10*3/uL (ref 0.00–0.07)
Basophils Absolute: 0 10*3/uL (ref 0.0–0.1)
Basophils Relative: 0 %
Eosinophils Absolute: 0.2 10*3/uL (ref 0.0–0.5)
Eosinophils Relative: 3 %
HCT: 44.6 % (ref 36.0–46.0)
Hemoglobin: 13.9 g/dL (ref 12.0–15.0)
Immature Granulocytes: 0 %
Lymphocytes Relative: 16 %
Lymphs Abs: 1.2 10*3/uL (ref 0.7–4.0)
MCH: 29.3 pg (ref 26.0–34.0)
MCHC: 31.2 g/dL (ref 30.0–36.0)
MCV: 93.9 fL (ref 80.0–100.0)
Monocytes Absolute: 0.5 10*3/uL (ref 0.1–1.0)
Monocytes Relative: 7 %
Neutro Abs: 5.7 10*3/uL (ref 1.7–7.7)
Neutrophils Relative %: 74 %
Platelets: 204 10*3/uL (ref 150–400)
RBC: 4.75 MIL/uL (ref 3.87–5.11)
RDW: 14.1 % (ref 11.5–15.5)
WBC: 7.6 10*3/uL (ref 4.0–10.5)
nRBC: 0 % (ref 0.0–0.2)

## 2021-06-19 LAB — COMPREHENSIVE METABOLIC PANEL
ALT: 39 U/L (ref 0–44)
AST: 33 U/L (ref 15–41)
Albumin: 3.9 g/dL (ref 3.5–5.0)
Alkaline Phosphatase: 93 U/L (ref 38–126)
Anion gap: 10 (ref 5–15)
BUN: 18 mg/dL (ref 6–20)
CO2: 23 mmol/L (ref 22–32)
Calcium: 9.5 mg/dL (ref 8.9–10.3)
Chloride: 104 mmol/L (ref 98–111)
Creatinine, Ser: 1.07 mg/dL — ABNORMAL HIGH (ref 0.44–1.00)
GFR, Estimated: 59 mL/min — ABNORMAL LOW (ref >60)
Glucose, Bld: 156 mg/dL — ABNORMAL HIGH (ref 70–99)
Potassium: 3.9 mmol/L (ref 3.5–5.1)
Sodium: 137 mmol/L (ref 135–145)
Total Bilirubin: 1.1 mg/dL (ref 0.3–1.2)
Total Protein: 8.5 g/dL — ABNORMAL HIGH (ref 6.5–8.1)

## 2021-06-19 LAB — LACTIC ACID, PLASMA
Lactic Acid, Venous: 2 mmol/L (ref 0.5–1.9)
Lactic Acid, Venous: 1.8 mmol/L (ref 0.5–1.9)

## 2021-06-19 MED ORDER — CEPHALEXIN 500 MG PO CAPS: 1000.0000 mg | ORAL_CAPSULE | Freq: Two times a day (BID) | ORAL | 0 refills | Status: DC

## 2021-06-19 MED ORDER — DOXYCYCLINE HYCLATE 100 MG PO TABS
100.0000 mg | ORAL_TABLET | Freq: Once | ORAL | Status: AC
  Administered 2021-06-19: 100 mg via ORAL
  Filled 2021-06-19: qty 1

## 2021-06-19 MED ORDER — CEFTRIAXONE SODIUM 1 G IJ SOLR
1.0000 g | Freq: Once | INTRAMUSCULAR | Status: AC
Start: 2021-06-19 — End: 2021-06-19
  Administered 2021-06-19: 1 g via INTRAVENOUS
  Filled 2021-06-19: qty 10

## 2021-06-19 MED ORDER — CEPHALEXIN 500 MG PO CAPS
500.0000 mg | ORAL_CAPSULE | Freq: Once | ORAL | Status: AC
  Administered 2021-06-19: 500 mg via ORAL
  Filled 2021-06-19: qty 1

## 2021-06-19 MED ORDER — SODIUM CHLORIDE 0.9 % IV BOLUS
1000.0000 mL | Freq: Once | INTRAVENOUS | Status: AC
  Administered 2021-06-19: 1000 mL via INTRAVENOUS

## 2021-06-19 MED ORDER — DOXYCYCLINE HYCLATE 100 MG PO TABS: 100.0000 mg | ORAL_TABLET | Freq: Two times a day (BID) | ORAL | 0 refills | Status: DC

## 2021-06-19 NOTE — ED Notes (Signed)
EDP Bradler notified of pt H/A 10/10 and pain medication requested at this time. ?

## 2021-06-19 NOTE — ED Provider Notes (Signed)
----------------------------------------- ?  3:20 PM on 06/19/2021 ?----------------------------------------- ? ?Blood pressure (!) 151/80, pulse 82, temperature 97.7 ?F (36.5 ?C), resp. rate 16, height 5\' 4"  (1.626 m), weight 136 kg, SpO2 98 %. ? ?Assuming care from Sherrie George, [place].  In short, Olivia Schroeder is a 61 y.o. female with a chief complaint of Rash and Fever ?Marland Kitchen  Refer to the original H&P for additional details. ? ?The current plan of care is to await urinalysis and repeat lactic.  Patient presented with fever several days ago.  Fever has resolved the patient developed what apparently is cellulitis to the flank and buttocks area.  Patient's initial labs were concerning for elevated lactic though white blood cell count is within normal limits.  Patient did arrive with a pulse of 102 but since then heart rate has been maintained in the upper 70s to low 80s.  Patient did receive IV antibiotics and fluids.  Awaiting urinalysis and lactic at this time.. ? ?----------------------------------------- ?7:57 PM on 06/19/2021 ?----------------------------------------- ?Patient with reassuring urinalysis with no signs of UTI.  Patient's lactic improved at this time.  She remains afebrile, no return of tachycardia.  Patient is stable for discharge at this time.  I will prescribe both doxycycline and Keflex for cellulitis.  Return precautions discussed at length with the patient.  Follow-up primary care as needed. ? ?  ?Darletta Moll, PA-C ?06/19/21 1958 ? ?  ?Naaman Plummer, MD ?06/23/21 [phone] ? ?

## 2021-06-19 NOTE — ED Notes (Addendum)
See triage note. Pt reports fever of 103.5 at highest Thursday, Friday fever 101 at highest, today no fever noted per pt. Redness and heat noted from R flank to buttocks; redness stops at middle of buttocks and does not travel to left cheek. Boundary of site marked. Pt denies itchiness or pain at site. Pt's resp reg/unlabored, skin dry, sitting calmly in chair. Pt reports had trouble with excessive diaphoresis during the night. Pt in NAD.  ?

## 2021-06-19 NOTE — ED Notes (Signed)
Patient ambulated to hallway bathroom with a steady gait. 

## 2021-06-19 NOTE — ED Provider Notes (Signed)
? ?[facility] ?Provider Note ? ? ? Event Date/Time  ? First MD Initiated Contact with Patient 06/19/21 1037   ?  (approximate) ? ? ?History  ? ?Rash and Fever ? ? ?HPI ? ?Olivia Schroeder is a 61 y.o. female with history of hidradenitis, diabetes, hypertension, sepsis, and and as listed in EMR presents to the emergency department for treatment and evaluation of rash after 2 days of fever of unknown origin.  Patient states that she developed a fever on Thursday that went as high as 103.5.  She treated it with antipyretics and rested on Thursday and Friday still had a fever but it was lower grade.  She states overnight last night the fever broke and she has not had a fever at all today.  During the time of the fever she had no other symptoms of concern.  No cough, sore throat, abdominal pain, nausea, vomiting, diarrhea, or dysuria.  Today, she noticed a red rash that had started to spread across her right hip and buttock that has continued to progressively get larger.  She states the area is warm.  She has not noticed any lesions in the area.  It does not itch.  No alleviating measures attempted.  She does report having had a URI a few weeks ago but states that the cough and all symptoms associated with that have resolved. ? ?  ? ? ?Physical Exam  ? ?Triage Vital Signs: ?ED Triage Vitals  ?Enc Vitals Group  ?   BP 06/19/21 0956 135/89  ?   Pulse Rate 06/19/21 0956 (!) 102  ?   Resp 06/19/21 0956 20  ?   Temp 06/19/21 0956 97.7 ?F (36.5 ?C)  ?   Temp Source 06/19/21 0956 Oral  ?   SpO2 06/19/21 0956 94 %  ?   Weight 06/19/21 0950 299 lb 13.2 oz (136 kg)  ?   Height 06/19/21 0950 5\' 4"  (1.626 m)  ?   Head Circumference --   ?   Peak Flow --   ?   Pain Score 06/19/21 0950 0  ?   Pain Loc --   ?   Pain Edu? --   ?   Excl. in [place]? --   ? ? ?Most recent vital signs: ?Vitals:  ? 06/19/21 1448 06/19/21 1920  ?BP: (!) 151/80 (!) 166/85  ?Pulse: 82 100  ?Resp: 16 20  ?Temp: 97.7 ?F (36.5 ?C) 99.3 ?F (37.4  ?C)  ?SpO2: 98% 99%  ? ? ?General: Awake, no distress.  ?CV:  Good peripheral perfusion.  ?Resp:  Normal effort.  ?Abd:  No distention.  ?Other:  Erythematous area across the right hip and transverse across the buttock and onto the left buttock.  No vesicles or raised areas are noted.  Area marked with surgical marking pen. ? ? ?ED Results / Procedures / Treatments  ? ?Labs ?(all labs ordered are listed, but only abnormal results are displayed) ?Labs Reviewed  ?COMPREHENSIVE METABOLIC PANEL - Abnormal; Notable for the following components:  ?    Result Value  ? Glucose, Bld 156 (*)   ? Creatinine, Ser 1.07 (*)   ? Total Protein 8.5 (*)   ? GFR, Estimated 59 (*)   ? All other components within normal limits  ?URINALYSIS, ROUTINE W REFLEX MICROSCOPIC - Abnormal; Notable for the following components:  ? Color, Urine YELLOW (*)   ? APPearance CLEAR (*)   ? Ketones, ur 20 (*)   ? Leukocytes,Ua  TRACE (*)   ? Bacteria, UA RARE (*)   ? All other components within normal limits  ?LACTIC ACID, PLASMA - Abnormal; Notable for the following components:  ? Lactic Acid, Venous 2.0 (*)   ? All other components within normal limits  ?CBC WITH DIFFERENTIAL/PLATELET  ?LACTIC ACID, PLASMA  ? ? ? ?EKG ? ?Not indicated ? ? ?RADIOLOGY ? ?Image and radiology report reviewed by me. ? ?Chest x-ray negative for acute cardiopulmonary abnormality. ? ?PROCEDURES: ? ?Critical Care performed: No ? ?Procedures ? ? ?MEDICATIONS ORDERED IN ED: ?Medications  ?sodium chloride 0.9 % bolus 1,000 mL (0 mLs Intravenous Stopped 06/19/21 1917)  ?cefTRIAXone (ROCEPHIN) 1 g in sodium chloride 0.9 % 100 mL IVPB (0 g Intravenous Stopped 06/19/21 1558)  ?doxycycline (VIBRA-TABS) tablet 100 mg (100 mg Oral Given 06/19/21 2004)  ?cephALEXin (KEFLEX) capsule 500 mg (500 mg Oral Given 06/19/21 2004)  ? ? ? ?IMPRESSION / MDM / ASSESSMENT AND PLAN / ED COURSE  ? ?I have reviewed the triage note. ? ?Differential diagnosis includes, but is not limited to cellulitis, viral  rash ? ?Labs, fluids, and antibiotics ordered. ? ?Initial lactic is 2.0. WBC is normal IV fluids running. Awaiting urinalysis. ? ?Care transitioned to J Cuthreill, PA-C at shift change to follow up on repeat lactic as well as urinalysis with plan for discharge on antibiotics unless second lactate is elevated. ? ?  ? ? ? ?FINAL CLINICAL IMPRESSION(S) / ED DIAGNOSES  ? ?Final diagnoses:  ?Cellulitis of buttock  ? ? ? ?Rx / DC Orders  ? ?ED Discharge Orders   ? ?      Ordered  ?  cephALEXin (KEFLEX) 500 MG capsule  2 times daily       ? 06/19/21 1958  ?  doxycycline (VIBRA-TABS) 100 MG tablet  2 times daily       ? 06/19/21 1958  ? ?  ?  ? ?  ? ? ? ?Note:  This document was prepared using Dragon voice recognition software and may include unintentional dictation errors. ?  Victorino Dike, FNP ?06/20/21 [id number] ? ?  ?Harvest Dark, MD ?06/20/21 1512 ? ?

## 2021-06-19 NOTE — ED Triage Notes (Signed)
Pt reports ran a fever Thursday and Friday and then today she noticed a rash on her right hip area. Pt reports feels fine but the fever came out of nowhere and now she has a rash so she wanted to be checked out.  ?

## 2021-07-31 ENCOUNTER — Other Ambulatory Visit: Payer: Self-pay

## 2021-07-31 ENCOUNTER — Emergency Department: Payer: Medicare HMO

## 2021-07-31 ENCOUNTER — Encounter: Payer: Self-pay | Admitting: Emergency Medicine

## 2021-07-31 ENCOUNTER — Emergency Department
Admission: EM | Admit: 2021-07-31 | Discharge: 2021-07-31 | Disposition: A | Discharge status: Home / Self Care | Payer: Medicare HMO | Attending: Emergency Medicine | Admitting: Emergency Medicine

## 2021-07-31 DIAGNOSIS — I10 Essential (primary) hypertension: Secondary | ICD-10-CM | POA: Diagnosis not present

## 2021-07-31 DIAGNOSIS — R55 Syncope and collapse: Secondary | ICD-10-CM | POA: Diagnosis not present

## 2021-07-31 DIAGNOSIS — E119 Type 2 diabetes mellitus without complications: Secondary | ICD-10-CM | POA: Diagnosis not present

## 2021-07-31 DIAGNOSIS — Y92838 Other recreation area as the place of occurrence of the external cause: Secondary | ICD-10-CM | POA: Diagnosis not present

## 2021-07-31 DIAGNOSIS — S0993XA Unspecified injury of face, initial encounter: Secondary | ICD-10-CM | POA: Diagnosis not present

## 2021-07-31 DIAGNOSIS — Y9301 Activity, walking, marching and hiking: Secondary | ICD-10-CM | POA: Insufficient documentation

## 2021-07-31 DIAGNOSIS — W19XXXA Unspecified fall, initial encounter: Secondary | ICD-10-CM

## 2021-07-31 DIAGNOSIS — W01198A Fall on same level from slipping, tripping and stumbling with subsequent striking against other object, initial encounter: Secondary | ICD-10-CM | POA: Insufficient documentation

## 2021-07-31 LAB — CBC
HCT: 44.1 % (ref 36.0–46.0)
Hemoglobin: 13.8 g/dL (ref 12.0–15.0)
MCH: 29.3 pg (ref 26.0–34.0)
MCHC: 31.3 g/dL (ref 30.0–36.0)
MCV: 93.6 fL (ref 80.0–100.0)
Platelets: 250 10*3/uL (ref 150–400)
RBC: 4.71 MIL/uL (ref 3.87–5.11)
RDW: 14 % (ref 11.5–15.5)
WBC: 6.6 10*3/uL (ref 4.0–10.5)
nRBC: 0 % (ref 0.0–0.2)

## 2021-07-31 LAB — BASIC METABOLIC PANEL
Anion gap: 7 (ref 5–15)
BUN: 25 mg/dL — ABNORMAL HIGH (ref 6–20)
CO2: 24 mmol/L (ref 22–32)
Calcium: 9.9 mg/dL (ref 8.9–10.3)
Chloride: 110 mmol/L (ref 98–111)
Creatinine, Ser: 1.18 mg/dL — ABNORMAL HIGH (ref 0.44–1.00)
GFR, Estimated: 53 mL/min — ABNORMAL LOW (ref >60)
Glucose, Bld: 149 mg/dL — ABNORMAL HIGH (ref 70–99)
Potassium: 4.2 mmol/L (ref 3.5–5.1)
Sodium: 141 mmol/L (ref 135–145)

## 2021-07-31 LAB — TROPONIN I (HIGH SENSITIVITY): Troponin I (High Sensitivity): 7 ng/L (ref <18)

## 2021-07-31 NOTE — ED Provider Notes (Signed)
[facility] Provider Note    Event Date/Time   First MD Initiated Contact with Patient 07/31/21 1346     (approximate)   History   Loss of Consciousness   HPI  Olivia Schroeder is a 61 y.o. female with a history of diabetes, hypertension who presents after a fall.  Patient reports that her foot got caught while she was walking with her hands full at a party.  She fell and struck the left side of her head.  She denies dizziness.  No neck pain.  No chest wall pain.  No abdominal pain.  No back pain or lower extremity pain      Physical Exam   Triage Vital Signs: ED Triage Vitals  Enc Vitals Group     BP 07/31/21 1304 138/74     Pulse Rate 07/31/21 1305 93     Resp --      Temp 07/31/21 1304 98.4 F (36.9 C)     Temp Source 07/31/21 1304 Oral     SpO2 07/31/21 1304 94 %     Weight 07/31/21 1255 136.1 kg (300 lb)     Height 07/31/21 1255 1.626 m (5\' 4" )     Head Circumference --      Peak Flow --      Pain Score --      Pain Loc --      Pain Edu? --      Excl. in [place]? --     Most recent vital signs: Vitals:   07/31/21 1304 07/31/21 1305  BP: 138/74   Pulse:  93  Temp: 98.4 F (36.9 C)   SpO2: 94%      General: Awake, no distress.  CV:  Good peripheral perfusion.  No chest wall tenderness palpation Resp:  Normal effort.  Abd:  No distention.  Other:  Face: No nasal swelling or tenderness, mild tenderness over the left inferior orbit, minimal swelling, no bony abnormalities palpated Normal range of motion of the lower extremities without pain.  No vertebral tenderness palpation.   ED Results / Procedures / Treatments   Labs (all labs ordered are listed, but only abnormal results are displayed) Labs Reviewed  BASIC METABOLIC PANEL - Abnormal; Notable for the following components:      Result Value   Glucose, Bld 149 (*)    BUN 25 (*)    Creatinine, Ser 1.18 (*)    GFR, Estimated 53 (*)    All other components within normal  limits  CBC  URINALYSIS, ROUTINE W REFLEX MICROSCOPIC  CBG MONITORING, ED  POC URINE PREG, ED  TROPONIN I (HIGH SENSITIVITY)     EKG ED ECG REPORT I, Lavonia Drafts, the attending physician, personally viewed and interpreted this ECG.  Date: 07/31/2021  Rhythm: normal sinus rhythm QRS Axis: normal Intervals: normal ST/T Wave abnormalities: normal Narrative Interpretation: no evidence of acute ischemia     RADIOLOGY CT head viewed interpret by me, no acute abnormality    PROCEDURES:  Critical Care performed:   Procedures   MEDICATIONS ORDERED IN ED: Medications - No data to display   IMPRESSION / MDM / [place] / ED COURSE  I reviewed the triage vital signs and the nursing notes. Patient's presentation is most consistent with acute presentation with potential threat to life or bodily function.  Patient presents for mechanical fall with a significant head injury.  Differential includes facial fracture, ICH, skull fracture  Overall exam is reassuring, will  send for CT head and CT max face to rule out intracranial hemorrhage and facial fracture  Lab work reviewed and is overall reassuring, consistent with mild dehydration, encourage increase p.o. intake  ----------------------------------------- 2:57 PM on 07/31/2021 ----------------------------------------- I have asked my colleague to follow-up on CT imaging, if normal anticipate discharge      FINAL CLINICAL IMPRESSION(S) / ED DIAGNOSES   Final diagnoses:  Fall, initial encounter  Facial injury, initial encounter     Rx / DC Orders   ED Discharge Orders     None        Note:  This document was prepared using Dragon voice recognition software and may include unintentional dictation errors.   Lavonia Drafts, MD 07/31/21 [phone]

## 2021-07-31 NOTE — Discharge Instructions (Addendum)
Please use Tylenol up to 4000 mg/day as needed for any continued headaches

## 2021-07-31 NOTE — ED Provider Notes (Signed)
Emergency department handoff note  Care of this patient was signed out to me at the end of the previous provider shift.  All pertinent patient information was conveyed and all questions were answered.  Patient pending CT of the head and maxillofacial structures that did not show any evidence of acute abnormalities.  Patient given instructions for concussion protocol.  The patient has been reexamined and is ready to be discharged.  All diagnostic results have been reviewed and discussed with the patient/family.  Care plan has been outlined and the patient/family understands all current diagnoses, results, and treatment plans.  There are no new complaints, changes, or physical findings at this time.  All questions have been addressed and answered.  Patient was instructed to, and agrees to follow-up with their primary care physician as well as return to the emergency department if any new or worsening symptoms develop.   Merwyn Katos, MD 07/31/21 [phone]

## 2021-07-31 NOTE — ED Triage Notes (Signed)
Pt via POV from home. Pt c/o syncopal episode, states it was witnessed. Pt states she did hit the L side of her face. Denies any blood thinners. Pt was out for a couple of seconds, bystanders states she was cool and clammy. Pt is A&Ox4 and NAD. Denies any hx of this happening before.

## 2021-11-28 ENCOUNTER — Encounter: Payer: Self-pay | Admitting: Emergency Medicine

## 2021-11-28 ENCOUNTER — Observation Stay
Admission: EM | Admit: 2021-11-28 | Discharge: 2021-11-29 | Disposition: A | Discharge status: Home / Self Care | Payer: Medicare HMO | Attending: Emergency Medicine | Admitting: Emergency Medicine

## 2021-11-28 ENCOUNTER — Other Ambulatory Visit: Payer: Self-pay

## 2021-11-28 ENCOUNTER — Emergency Department: Payer: Medicare HMO

## 2021-11-28 DIAGNOSIS — E119 Type 2 diabetes mellitus without complications: Secondary | ICD-10-CM | POA: Diagnosis not present

## 2021-11-28 DIAGNOSIS — E785 Hyperlipidemia, unspecified: Secondary | ICD-10-CM | POA: Insufficient documentation

## 2021-11-28 DIAGNOSIS — K7460 Unspecified cirrhosis of liver: Secondary | ICD-10-CM | POA: Diagnosis not present

## 2021-11-28 DIAGNOSIS — I10 Essential (primary) hypertension: Secondary | ICD-10-CM | POA: Diagnosis not present

## 2021-11-28 DIAGNOSIS — J9601 Acute respiratory failure with hypoxia: Secondary | ICD-10-CM | POA: Insufficient documentation

## 2021-11-28 DIAGNOSIS — Z79899 Other long term (current) drug therapy: Secondary | ICD-10-CM | POA: Diagnosis not present

## 2021-11-28 DIAGNOSIS — J069 Acute upper respiratory infection, unspecified: Secondary | ICD-10-CM

## 2021-11-28 DIAGNOSIS — Z7982 Long term (current) use of aspirin: Secondary | ICD-10-CM | POA: Diagnosis not present

## 2021-11-28 DIAGNOSIS — R059 Cough, unspecified: Secondary | ICD-10-CM | POA: Diagnosis present

## 2021-11-28 DIAGNOSIS — A419 Sepsis, unspecified organism: Secondary | ICD-10-CM | POA: Diagnosis not present

## 2021-11-28 DIAGNOSIS — U071 COVID-19: Secondary | ICD-10-CM | POA: Diagnosis not present

## 2021-11-28 DIAGNOSIS — I159 Secondary hypertension, unspecified: Secondary | ICD-10-CM

## 2021-11-28 DIAGNOSIS — Z6841 Body Mass Index (BMI) 40.0 and over, adult: Secondary | ICD-10-CM | POA: Insufficient documentation

## 2021-11-28 LAB — CBC WITH DIFFERENTIAL/PLATELET
Abs Immature Granulocytes: 0.03 10*3/uL (ref 0.00–0.07)
Basophils Absolute: 0 10*3/uL (ref 0.0–0.1)
Basophils Relative: 0 %
Eosinophils Absolute: 0.1 10*3/uL (ref 0.0–0.5)
Eosinophils Relative: 1 %
HCT: 44.8 % (ref 36.0–46.0)
Hemoglobin: 14.1 g/dL (ref 12.0–15.0)
Immature Granulocytes: 0 %
Lymphocytes Relative: 6 %
Lymphs Abs: 0.8 10*3/uL (ref 0.7–4.0)
MCH: 30.1 pg (ref 26.0–34.0)
MCHC: 31.5 g/dL (ref 30.0–36.0)
MCV: 95.7 fL (ref 80.0–100.0)
Monocytes Absolute: 0.5 10*3/uL (ref 0.1–1.0)
Monocytes Relative: 4 %
Neutro Abs: 10.6 10*3/uL — ABNORMAL HIGH (ref 1.7–7.7)
Neutrophils Relative %: 89 %
Platelets: 244 10*3/uL (ref 150–400)
RBC: 4.68 MIL/uL (ref 3.87–5.11)
RDW: 13.6 % (ref 11.5–15.5)
WBC: 12 10*3/uL — ABNORMAL HIGH (ref 4.0–10.5)
nRBC: 0 % (ref 0.0–0.2)

## 2021-11-28 LAB — URINALYSIS, ROUTINE W REFLEX MICROSCOPIC
Bilirubin Urine: NEGATIVE
Glucose, UA: NEGATIVE mg/dL
Hgb urine dipstick: NEGATIVE
Ketones, ur: NEGATIVE mg/dL
Leukocytes,Ua: NEGATIVE
Nitrite: NEGATIVE
Protein, ur: NEGATIVE mg/dL
Specific Gravity, Urine: 1.015 (ref 1.005–1.030)
pH: 5 (ref 5.0–8.0)

## 2021-11-28 LAB — HEPATIC FUNCTION PANEL
ALT: 34 U/L (ref 0–44)
AST: 31 U/L (ref 15–41)
Albumin: 4.2 g/dL (ref 3.5–5.0)
Alkaline Phosphatase: 108 U/L (ref 38–126)
Bilirubin, Direct: 0.2 mg/dL (ref 0.0–0.2)
Indirect Bilirubin: 0.7 mg/dL (ref 0.3–0.9)
Total Bilirubin: 0.9 mg/dL (ref 0.3–1.2)
Total Protein: 8.4 g/dL — ABNORMAL HIGH (ref 6.5–8.1)

## 2021-11-28 LAB — PROTIME-INR
INR: 1.1 (ref 0.8–1.2)
Prothrombin Time: 14.2 seconds (ref 11.4–15.2)

## 2021-11-28 LAB — BASIC METABOLIC PANEL
Anion gap: 9 (ref 5–15)
BUN: 20 mg/dL (ref 8–23)
CO2: 26 mmol/L (ref 22–32)
Calcium: 10 mg/dL (ref 8.9–10.3)
Chloride: 102 mmol/L (ref 98–111)
Creatinine, Ser: 0.99 mg/dL (ref 0.44–1.00)
GFR, Estimated: >60 mL/min (ref >60)
Glucose, Bld: 170 mg/dL — ABNORMAL HIGH (ref 70–99)
Potassium: 4.5 mmol/L (ref 3.5–5.1)
Sodium: 137 mmol/L (ref 135–145)

## 2021-11-28 LAB — APTT: aPTT: 36 seconds (ref 24–36)

## 2021-11-28 LAB — PROCALCITONIN: Procalcitonin: <0.1 ng/mL

## 2021-11-28 LAB — MAGNESIUM: Magnesium: 1.9 mg/dL (ref 1.7–2.4)

## 2021-11-28 LAB — LACTIC ACID, PLASMA
Lactic Acid, Venous: 1.5 mmol/L (ref 0.5–1.9)
Lactic Acid, Venous: 1.2 mmol/L (ref 0.5–1.9)

## 2021-11-28 LAB — HEMOGLOBIN A1C
Hgb A1c MFr Bld: 6.1 % — ABNORMAL HIGH (ref 4.8–5.6)
Mean Plasma Glucose: 128.37 mg/dL

## 2021-11-28 LAB — AMMONIA: Ammonia: 27 umol/L (ref 9–35)

## 2021-11-28 LAB — CBG MONITORING, ED: Glucose-Capillary: 125 mg/dL — ABNORMAL HIGH (ref 70–99)

## 2021-11-28 LAB — D-DIMER, QUANTITATIVE: D-Dimer, Quant: 0.41 ug/mL-FEU (ref 0.00–0.50)

## 2021-11-28 LAB — SARS CORONAVIRUS 2 BY RT PCR: SARS Coronavirus 2 by RT PCR: POSITIVE — AB

## 2021-11-28 MED ORDER — SODIUM CHLORIDE 0.9 % IV SOLN: INTRAVENOUS | Status: DC

## 2021-11-28 MED ORDER — METOPROLOL TARTRATE 5 MG/5ML IV SOLN: 5.0000 mg | INTRAVENOUS | Status: DC | PRN

## 2021-11-28 MED ORDER — METOPROLOL TARTRATE 5 MG/5ML IV SOLN
5.0000 mg | Freq: Once | INTRAVENOUS | Status: AC
  Administered 2021-11-28: 5 mg via INTRAVENOUS
  Filled 2021-11-28: qty 5

## 2021-11-28 MED ORDER — SODIUM CHLORIDE 0.9 % IV BOLUS
500.0000 mL | Freq: Once | INTRAVENOUS | Status: AC
  Administered 2021-11-28: 500 mL via INTRAVENOUS

## 2021-11-28 MED ORDER — INSULIN ASPART 100 UNIT/ML IJ SOLN
0.0000 [IU] | Freq: Three times a day (TID) | INTRAMUSCULAR | Status: DC
  Administered 2021-11-29: 3 [IU] via SUBCUTANEOUS
  Filled 2021-11-28: qty 1

## 2021-11-28 MED ORDER — SODIUM CHLORIDE 0.9 % IV SOLN: 2.0000 g | INTRAVENOUS | Status: DC

## 2021-11-28 MED ORDER — SODIUM CHLORIDE 0.9 % IV SOLN
1.0000 g | Freq: Once | INTRAVENOUS | Status: AC
  Administered 2021-11-28: 1 g via INTRAVENOUS
  Filled 2021-11-28: qty 10

## 2021-11-28 MED ORDER — DIPHENHYDRAMINE HCL 50 MG/ML IJ SOLN: 25.0000 mg | Freq: Three times a day (TID) | INTRAMUSCULAR | Status: DC | PRN

## 2021-11-28 MED ORDER — ALBUTEROL SULFATE HFA 108 (90 BASE) MCG/ACT IN AERS: 2.0000 | INHALATION_SPRAY | RESPIRATORY_TRACT | Status: DC | PRN

## 2021-11-28 MED ORDER — ENOXAPARIN SODIUM 80 MG/0.8ML IJ SOSY
0.5000 mg/kg | PREFILLED_SYRINGE | INTRAMUSCULAR | Status: DC
  Administered 2021-11-28: 67.5 mg via SUBCUTANEOUS
  Filled 2021-11-28: qty 0.68

## 2021-11-28 MED ORDER — VITAMIN C 500 MG PO TABS
500.0000 mg | ORAL_TABLET | Freq: Every day | ORAL | Status: DC
  Administered 2021-11-29: 500 mg via ORAL
  Filled 2021-11-28: qty 1

## 2021-11-28 MED ORDER — SODIUM CHLORIDE 0.9 % IV BOLUS
1000.0000 mL | Freq: Once | INTRAVENOUS | Status: AC
  Administered 2021-11-28: 1000 mL via INTRAVENOUS

## 2021-11-28 MED ORDER — INSULIN ASPART 100 UNIT/ML IJ SOLN
0.0000 [IU] | Freq: Every day | INTRAMUSCULAR | Status: DC
  Filled 2021-11-28: qty 1

## 2021-11-28 MED ORDER — NIRMATRELVIR/RITONAVIR (PAXLOVID)TABLET
3.0000 | ORAL_TABLET | Freq: Two times a day (BID) | ORAL | Status: DC
  Administered 2021-11-28 – 2021-11-29 (×2): 3 via ORAL
  Filled 2021-11-28: qty 30

## 2021-11-28 MED ORDER — DM-GUAIFENESIN ER 30-600 MG PO TB12: 1.0000 | ORAL_TABLET | Freq: Two times a day (BID) | ORAL | Status: DC | PRN

## 2021-11-28 MED ORDER — SODIUM CHLORIDE 0.9 % IV SOLN: 1.0000 g | Freq: Once | INTRAVENOUS | Status: DC

## 2021-11-28 MED ORDER — IBUPROFEN 400 MG PO TABS: 400.0000 mg | ORAL_TABLET | Freq: Four times a day (QID) | ORAL | Status: DC | PRN

## 2021-11-28 MED ORDER — SODIUM CHLORIDE 0.9 % IV SOLN
500.0000 mg | INTRAVENOUS | Status: DC
  Administered 2021-11-28: 500 mg via INTRAVENOUS
  Filled 2021-11-28: qty 5

## 2021-11-28 MED ORDER — HYDRALAZINE HCL 20 MG/ML IJ SOLN: 5.0000 mg | INTRAMUSCULAR | Status: DC | PRN

## 2021-11-28 MED ORDER — URSODIOL 300 MG PO CAPS
900.0000 mg | ORAL_CAPSULE | Freq: Two times a day (BID) | ORAL | Status: DC
  Administered 2021-11-29 (×2): 900 mg via ORAL
  Filled 2021-11-28 (×2): qty 3

## 2021-11-28 MED ORDER — ESCITALOPRAM OXALATE 10 MG PO TABS
20.0000 mg | ORAL_TABLET | Freq: Every day | ORAL | Status: DC
  Administered 2021-11-29: 20 mg via ORAL
  Filled 2021-11-28: qty 2

## 2021-11-28 MED ORDER — ATORVASTATIN CALCIUM 20 MG PO TABS: 40.0000 mg | ORAL_TABLET | Freq: Every evening | ORAL | Status: DC

## 2021-11-28 MED ORDER — METHYLPREDNISOLONE SODIUM SUCC 125 MG IJ SOLR
125.0000 mg | INTRAMUSCULAR | Status: AC
  Administered 2021-11-28: 125 mg via INTRAVENOUS
  Filled 2021-11-28: qty 2

## 2021-11-28 MED ORDER — METHYLPREDNISOLONE SODIUM SUCC 125 MG IJ SOLR
120.0000 mg | INTRAMUSCULAR | Status: DC
  Administered 2021-11-29: 120 mg via INTRAVENOUS
  Filled 2021-11-28: qty 2

## 2021-11-28 MED ORDER — ACETAMINOPHEN 325 MG PO TABS
650.0000 mg | ORAL_TABLET | Freq: Once | ORAL | Status: AC
  Administered 2021-11-28: 650 mg via ORAL
  Filled 2021-11-28: qty 2

## 2021-11-28 MED ORDER — VITAMIN D 25 MCG (1000 UNIT) PO TABS
1000.0000 [IU] | ORAL_TABLET | Freq: Every day | ORAL | Status: DC
  Administered 2021-11-29: 1000 [IU] via ORAL
  Filled 2021-11-28: qty 1

## 2021-11-28 NOTE — Assessment & Plan Note (Signed)
Acute respiratory disease and sepsis due to COVID-19 infection: Patient meets criteria for sepsis with heart rate up to 150 and fever 101.6.  Lactic acid normal 1.5.  COVID test positive.  Patient also has leukocytosis with WBC 12.0, cannot completely rule out superimposed bacterial infection.  Will start empiric antibiotics.   -Admited to progressive unit as inpatient - IV Rocephin and azithromycin, doxycycline -Paxlovid - Solu-Medrol 60 mg twice daily - Mucinex for cough  - Bronchodilators - Urine legionella and S. pneumococcal antigen - Follow up blood culture x2, sputum culture - will get Procalcitonin  - IVF: 2.5L of NS bolus in ED, followed by 100 mL per hour of NS - prn metoprolol for HR>130

## 2021-11-28 NOTE — Assessment & Plan Note (Signed)
Recent A1c 6.3, well controlled.  Patient states that she is not taking Ozempic at home -Sliding scale insulin

## 2021-11-28 NOTE — ED Provider Notes (Signed)
[facility] Provider Note    Event Date/Time   First MD Initiated Contact with Patient 11/28/21 1337     (approximate)   History   Generalized Body Aches, Cough, and Chills   HPI  Olivia Schroeder is a 61 y.o. female with history of hypertension, diabetes, cirrhosis of the liver, and remaining history as listed in the EMR presents to the emergency department for treatment and evaluation of headache, cough, body aches, congestion, and fever.  She reports that she developed URI symptoms few weeks ago and was evaluated by primary care.  Her COVID test at that time was negative and she was advised to treat it symptomatically.  Last night she developed a headache, body aches, chills, and the cough got worse.      Physical Exam   Triage Vital Signs: ED Triage Vitals  Enc Vitals Group     BP 11/28/21 1209 (!) 167/78     Pulse Rate 11/28/21 1209 (!) 119     Resp 11/28/21 1209 20     Temp 11/28/21 1209 99.6 F (37.6 C)     Temp Source 11/28/21 1209 Oral     SpO2 11/28/21 1209 97 %     Weight 11/28/21 1150 (!) 302 lb 0.5 oz (137 kg)     Height 11/28/21 1150 5\' 4"  (1.626 m)     Head Circumference --      Peak Flow --      Pain Score 11/28/21 1150 8     Pain Loc --      Pain Edu? --      Excl. in [place]? --     Most recent vital signs: Vitals:   11/28/21 1209  BP: (!) 167/78  Pulse: (!) 119  Resp: 20  Temp: 99.6 F (37.6 C)  SpO2: 97%     General: Awake, no distress.  CV:  Good peripheral perfusion.  Resp:  Normal effort.  Abd:  No distention.  Other:  Breath sounds diminished throughout   ED Results / Procedures / Treatments   Labs (all labs ordered are listed, but only abnormal results are displayed) Labs Reviewed  SARS CORONAVIRUS 2 BY RT PCR - Abnormal; Notable for the following components:      Result Value   SARS Coronavirus 2 by RT PCR POSITIVE (*)    All other components within normal limits  CBC WITH DIFFERENTIAL/PLATELET -  Abnormal; Notable for the following components:   WBC 12.0 (*)    Neutro Abs 10.6 (*)    All other components within normal limits  URINALYSIS, ROUTINE W REFLEX MICROSCOPIC - Abnormal; Notable for the following components:   Color, Urine YELLOW (*)    APPearance CLEAR (*)    All other components within normal limits  BASIC METABOLIC PANEL - Abnormal; Notable for the following components:   Glucose, Bld 170 (*)    All other components within normal limits  D-DIMER, QUANTITATIVE  LACTIC ACID, PLASMA  LACTIC ACID, PLASMA     EKG  Pending   RADIOLOGY  Chest x-ray viewed and interpreted by me: no change from previous. Radiology reading indicates left basilar pulmonary opacity, atelectasis, aspiration, or infection.  PROCEDURES:  Critical Care performed: No  Procedures   MEDICATIONS ORDERED IN ED: Medications  sodium chloride 0.9 % bolus 1,000 mL (has no administration in time range)  cefTRIAXone (ROCEPHIN) 1 g in sodium chloride 0.9 % 100 mL IVPB (1 g Intravenous New Bag/Given 11/28/21 1555)  methylPREDNISolone sodium  succinate (SOLU-MEDROL) 125 mg/2 mL injection 125 mg (125 mg Intravenous Given 11/28/21 1554)     IMPRESSION / MDM / ASSESSMENT AND PLAN / ED COURSE  I reviewed the triage vital signs and the nursing notes.                              Differential diagnosis includes, but is not limited to, upper respiratory infection, COVID, influenza, pneumonia, PE  Patient's presentation is most consistent with acute presentation with potential threat to life or bodily function.  61 year old female presenting to the emergency department for treatment and evaluation of viral symptoms as described in the HPI.  Prior to ER room assignment, protocols were performed.  She is COVID-positive.  Vitals indicate tachycardia and low-grade temp of 99.6.  I have low suspicion for sepsis.  Labs show a very mild leukocytosis of 12.0 with a left shift of 10.6. Lactic ordered as well as  d-dimer. Oxygen saturation is 91% on room air and she remains tachycardic at 117.  Care will be transitioned to Dr. Cinda Quest who will follow up on labs and imaging.     FINAL CLINICAL IMPRESSION(S) / ED DIAGNOSES   Final diagnoses:  COVID     Rx / DC Orders   ED Discharge Orders     None        Note:  This document was prepared using Dragon voice recognition software and may include unintentional dictation errors.   Victorino Dike, FNP 11/28/21 1604    Nena Polio, MD 11/28/21 [phone]

## 2021-11-28 NOTE — Assessment & Plan Note (Signed)
lipitor

## 2021-11-28 NOTE — ED Provider Triage Note (Signed)
Emergency Medicine Provider Triage Evaluation Note  Olivia Schroeder , a 61 y.o. female  was evaluated in triage.  Pt complains of body aches, cough, congestion for several days. No antipyretics today. Fever 102F last night.  Review of Systems  Positive: Cough, congestion Negative: Abd pain, dysuria, chest pain  Physical Exam  Ht 5\' 4"  (1.626 m)   Wt (!) 137 kg   BMI 51.84 kg/m  Gen:   Awake, no distress   Resp:  Normal effort  MSK:   Moves extremities without difficulty  Other:    Medical Decision Making  Medically screening exam initiated at 12:07 PM.  Appropriate orders placed.  Olivia Schroeder was informed that the remainder of the evaluation will be completed by another provider, this initial triage assessment does not replace that evaluation, and the importance of remaining in the ED until their evaluation is complete.     Marquette Old, PA-C 11/28/21 1212

## 2021-11-28 NOTE — ED Notes (Signed)
Messaged Dr Blaine Hamper, pt already received IV rocephin.

## 2021-11-28 NOTE — Assessment & Plan Note (Signed)
-  see above 

## 2021-11-28 NOTE — ED Triage Notes (Signed)
Pt reports general bodyaches, cough, congestion and fever for several days.

## 2021-11-28 NOTE — Assessment & Plan Note (Signed)
-  hold home blood pressure medications due to sepsis and risk of developing hypotension -IV hydralazine as needed

## 2021-11-28 NOTE — Assessment & Plan Note (Signed)
Mental status normal -Check INR, PTT, ammonia level -Check liver function

## 2021-11-28 NOTE — Assessment & Plan Note (Signed)
  BMI= 51.84   and BW= 137kg -Diet and exercise.   -Encouraged to lose weight.

## 2021-11-28 NOTE — H&P (Signed)
History and Physical    Olivia Schroeder VQQ:[id number] DOB: [birth year] DOA: 11/28/2021  Referring MD/NP/PA:   PCP: [facility]   Patient coming from:  The patient is coming from home.       Chief Complaint: Fever, chills, cough, shortness of breath  HPI: Olivia Schroeder is a 61 y.o. female with medical history significant of hypertension, hyperlipidemia, diabetes mellitus, liver cirrhosis, depression, morbid obesity with BMI 51.84, who presents with fever, use, cough, shortness of breath.  Patient states that she has been sick for more than 3 days, symptoms include dry cough, fever, chills, headache, body aches, mild shortness breath.  Denies chest pain.  No nausea, vomiting, diarrhea or abdominal pain.  No symptoms of UTI.  Data reviewed independently and ED Course: pt was found to have positive COVID, WBC 12.0, lactic acid 1.5, negative urinalysis, GFR> 60, temperature 101.6, blood pressure 146/84, heart rate 154, RR 20, current oxygen saturation 91-97% on room air (patient had 2 episode of desaturating to 88% in ED).  Chest x-ray showed left basilar opacity.  Patient is admitted to PCU as inpatient   EKG: I have personally reviewed.  Tachycardia, heart rate 123, QTc 535,  Review of Systems:   General: has fevers, chills, no body weight gain, has poor appetite, has fatigue HEENT: no blurry vision, hearing changes or sore throat Respiratory: has dyspnea, coughing, no wheezing CV: no chest pain, no palpitations GI: no nausea, vomiting, abdominal pain, diarrhea, constipation GU: no dysuria, burning on urination, increased urinary frequency, hematuria  Ext: no leg edema Neuro: no unilateral weakness, numbness, or tingling, no vision change or hearing loss Skin: no rash, no skin tear. MSK: No muscle spasm, no deformity, no limitation of range of movement in spin Heme: No easy bruising.  Travel history: No recent long distant travel.   Allergy:  Allergies  Allergen  Reactions   Penicillins Hives   Sulfa Antibiotics Hives    Past Medical History:  Diagnosis Date   Cirrhosis of liver ([place])    Depression    Diabetes mellitus without complication ([place])    Hidradenitis    Hypertension     Past Surgical History:  Procedure Laterality Date   BREAST BIOPSY Left 10/01/2014   neg   LAPAROSCOPIC GASTRIC SLEEVE RESECTION     REDUCTION MAMMAPLASTY Bilateral 2014    Social History:  reports that she has never smoked. She has never used smokeless tobacco. She reports that she does not drink alcohol and does not use drugs.  Family History:  Family History  Problem Relation Age of Onset   Cancer Paternal Grandfather        unsure of what type   Breast cancer Neg Hx      Prior to Admission medications   Medication Sig Start Date End Date Taking? Authorizing Provider  aspirin 81 MG chewable tablet Chew 81 mg by mouth daily.    [provider]  atorvastatin (LIPITOR) 40 MG tablet Take 40 mg by mouth daily.    [provider]  cephALEXin (KEFLEX) 500 MG capsule Take 2 capsules (1,000 mg total) by mouth 2 (two) times daily. 06/19/21   Cuthriell, Charline Bills, PA-C  doxycycline (VIBRA-TABS) 100 MG tablet Take 1 tablet (100 mg total) by mouth 2 (two) times daily. 06/19/21   Darletta Moll, PA-C    Physical Exam: Vitals:   11/28/21 1723 11/28/21 1730 11/28/21 1745 11/28/21 1800  BP: (!) 146/83 (!) 142/81  130/83  Pulse: Marland Kitchen)  122 (!) 114 (!) 112 (!) 112  Resp:  18 (!) 22 (!) 22  Temp:      TempSrc:      SpO2: 92% (!) 88% 95% 95%  Weight:      Height:       General: Not in acute distress HEENT:       Eyes: PERRL, EOMI, no scleral icterus.       ENT: No discharge from the ears and nose, no pharynx injection, no tonsillar enlargement.        Neck: No JVD, no bruit, no mass felt. Heme: No neck lymph node enlargement. Cardiac: S1/S2, RRR, No murmurs, No gallops or rubs. Respiratory: No rales, wheezing, rhonchi or rubs. GI: Soft,  nondistended, nontender, no rebound pain, no organomegaly, BS present. GU: No hematuria Ext: No pitting leg edema bilaterally. 1+DP/PT pulse bilaterally. Musculoskeletal: No joint deformities, No joint redness or warmth, no limitation of ROM in spin. Skin: No rashes.  Neuro: Alert, oriented X3, cranial nerves II-XII grossly intact, moves all extremities normally.  Psych: Patient is not psychotic, no suicidal or hemocidal ideation.  Labs on Admission: I have personally reviewed following labs and imaging studies  CBC: Recent Labs  Lab 11/28/21 1239  WBC 12.0*  NEUTROABS 10.6*  HGB 14.1  HCT 44.8  MCV 95.7  PLT 244   Basic Metabolic Panel: Recent Labs  Lab 11/28/21 1239  NA 137  K 4.5  CL 102  CO2 26  GLUCOSE 170*  BUN 20  CREATININE 0.99  CALCIUM 10.0   GFR: Estimated Creatinine Clearance: 82.5 mL/min (by C-G formula based on SCr of 0.99 mg/dL). Liver Function Tests: Recent Labs  Lab 11/28/21 1239  AST 31  ALT 34  ALKPHOS 108  BILITOT 0.9  PROT 8.4*  ALBUMIN 4.2   No results for input(s): "LIPASE", "AMYLASE" in the last 168 hours. No results for input(s): "AMMONIA" in the last 168 hours. Coagulation Profile: Recent Labs  Lab 11/28/21 1557  INR 1.1   Cardiac Enzymes: No results for input(s): "CKTOTAL", "CKMB", "CKMBINDEX", "TROPONINI" in the last 168 hours. BNP (last 3 results) No results for input(s): "PROBNP" in the last 8760 hours. HbA1C: No results for input(s): "HGBA1C" in the last 72 hours. CBG: No results for input(s): "GLUCAP" in the last 168 hours. Lipid Profile: No results for input(s): "CHOL", "HDL", "LDLCALC", "TRIG", "CHOLHDL", "LDLDIRECT" in the last 72 hours. Thyroid Function Tests: No results for input(s): "TSH", "T4TOTAL", "FREET4", "T3FREE", "THYROIDAB" in the last 72 hours. Anemia Panel: No results for input(s): "VITAMINB12", "FOLATE", "FERRITIN", "TIBC", "IRON", "RETICCTPCT" in the last 72 hours. Urine analysis:    Component  Value Date/Time   COLORURINE YELLOW (A) 11/28/2021 1239   APPEARANCEUR CLEAR (A) 11/28/2021 1239   APPEARANCEUR Cloudy 07/07/2011 2259   LABSPEC 1.015 11/28/2021 1239   LABSPEC 1.025 07/07/2011 2259   PHURINE 5.0 11/28/2021 1239   GLUCOSEU NEGATIVE 11/28/2021 1239   GLUCOSEU Negative 07/07/2011 2259   HGBUR NEGATIVE 11/28/2021 1239   BILIRUBINUR NEGATIVE 11/28/2021 1239   BILIRUBINUR Negative 07/07/2011 2259   KETONESUR NEGATIVE 11/28/2021 1239   PROTEINUR NEGATIVE 11/28/2021 1239   NITRITE NEGATIVE 11/28/2021 1239   LEUKOCYTESUR NEGATIVE 11/28/2021 1239   LEUKOCYTESUR 3+ 07/07/2011 2259   Sepsis Labs: @LABRCNTIP (procalcitonin:4,lacticidven:4) ) Recent Results (from the past 240 hour(s))  SARS Coronavirus 2 by RT PCR (hospital order, performed in [facility] hospital lab) *cepheid single result test* Anterior Nasal Swab     Status: Abnormal   Collection Time: 11/28/21 12:11  PM   Specimen: Anterior Nasal Swab  Result Value Ref Range Status   SARS Coronavirus 2 by RT PCR POSITIVE (A) NEGATIVE Final    Comment: (NOTE) SARS-CoV-2 target nucleic acids are DETECTED  SARS-CoV-2 RNA is generally detectable in upper respiratory specimens  during the acute phase of infection.  Positive results are indicative  of the presence of the identified virus, but do not rule out bacterial infection or co-infection with other pathogens not detected by the test.  Clinical correlation with patient history and  other diagnostic information is necessary to determine patient infection status.  The expected result is negative.  Fact Sheet for Patients:   RoadLapTop.co.za   Fact Sheet for Healthcare Providers:   http://kim-miller.com/    This test is not yet approved or cleared by the [place] FDA and  has been authorized for detection and/or diagnosis of SARS-CoV-2 by FDA under an Emergency Use Authorization (EUA).  This EUA will remain in effect  (meaning this test can be used) for the duration of  the COVID-19 declaration under Section 564(b)(1)  of the Act, 21 U.S.C. section 360-bbb-3(b)(1), unless the authorization is terminated or revoked sooner.   Performed at [facility], [address], [place], [place] [address]      Radiological Exams on Admission: DG Chest 2 View  Result Date: 11/28/2021 CLINICAL DATA:  Cough and fever EXAM: CHEST - 2 VIEW COMPARISON:  Chest x-ray June 19, 2021 FINDINGS: The cardiomediastinal silhouette is unchanged in contour. Left basilar pulmonary opacity. No focal right pulmonary opacity. No pleural effusion or pneumothorax. Visualized upper abdomen is unremarkable. No acute osseous abnormality. IMPRESSION: Left basilar pulmonary opacity, possibly atelectasis, aspiration, or infection. Electronically Signed   By: Jacob Moores M.D.   On: 11/28/2021 12:46      Assessment/Plan Principal Problem:   Acute respiratory disease due to COVID-19 virus Active Problems:   Sepsis (HCC)   Hypertension   Diabetes mellitus without complication (HCC)   Cirrhosis of liver (HCC)   Morbid obesity with body mass index (BMI) of 50.0 to 59.9 in adult (HCC)   HLD (hyperlipidemia)   Assessment and Plan: * Acute respiratory disease due to COVID-19 virus Acute respiratory disease and sepsis due to COVID-19 infection: Patient meets criteria for sepsis with heart rate up to 150 and fever 101.6.  Lactic acid normal 1.5.  COVID test positive.  Patient also has leukocytosis with WBC 12.0, cannot completely rule out superimposed bacterial infection.  Will start empiric antibiotics.   -Admited to progressive unit as inpatient - IV Rocephin and azithromycin, doxycycline -Paxlovid - Solu-Medrol 60 mg twice daily - Mucinex for cough  - Bronchodilators - Urine legionella and S. pneumococcal antigen - Follow up blood culture x2, sputum culture - will get Procalcitonin  - IVF: 2.5L of NS bolus in ED, followed  by 100 mL per hour of NS - prn metoprolol for HR>130  Sepsis (HCC) -see above  Hypertension -hold home blood pressure medications due to sepsis and risk of developing hypotension -IV hydralazine as needed  Diabetes mellitus without complication (HCC) Recent A1c 6.3, well controlled.  Patient states that she is not taking Ozempic at home -Sliding scale insulin  Cirrhosis of liver (HCC) Mental status normal -Check INR, PTT, ammonia level -Check liver function  Morbid obesity with body mass index (BMI) of 50.0 to 59.9 in adult (HCC)  BMI= 51.84   and BW= 137kg -Diet and exercise.   -Encouraged to lose weight.    HLD (  hyperlipidemia) -lipitor          DVT ppx:  SQ Lovenox  Code Status: Full code  Family Communication:  Yes, patient's son   at bed side.      Disposition Plan:  Anticipate discharge back to previous environment  Consults called:  none  Admission status and Level of care: Progressive:     as inpt        Dispo: The patient is from: Home              Anticipated d/c is to: Home              Anticipated d/c date is: 2 days              Patient currently is not medically stable to d/c.    Severity of Illness:  The appropriate patient status for this patient is INPATIENT. Inpatient status is judged to be reasonable and necessary in order to provide the required intensity of service to ensure the patient's safety. The patient's presenting symptoms, physical exam findings, and initial radiographic and laboratory data in the context of their chronic comorbidities is felt to place them at high risk for further clinical deterioration. Furthermore, it is not anticipated that the patient will be medically stable for discharge from the hospital within 2 midnights of admission.   * I certify that at the point of admission it is my clinical judgment that the patient will require inpatient hospital care spanning beyond 2 midnights from the point of admission  due to high intensity of service, high risk for further deterioration and high frequency of surveillance required.*       Date of Service 11/28/2021    Lorretta Harp [facility]   If 7PM-7AM, please contact night-coverage [email or web address] 11/28/2021, 6:18 PM

## 2021-11-28 NOTE — ED Provider Notes (Signed)
D-dimer is negative.  Lactic acid is negative.  EKG shows what may be an accelerated junctional rhythm and struggling to find P waves at a rate of 123.  Looking at the rest of her vital signs and lab work etc. looks like she actually meets sepsis criteria.  She does have COVID.  She also has a differential with a lot of polys.      ----------------------------------------- 4:46 PM on 11/28/2021 ----------------------------------------- When she walks her heart rate gets up to 151 O2 sats to 91.  I think it would be better if we watched her overnight.     Nena Polio, MD 11/28/21 [phone]

## 2021-11-29 ENCOUNTER — Encounter: Payer: Self-pay | Admitting: Internal Medicine

## 2021-11-29 DIAGNOSIS — J9601 Acute respiratory failure with hypoxia: Secondary | ICD-10-CM | POA: Diagnosis present

## 2021-11-29 DIAGNOSIS — U071 COVID-19: Secondary | ICD-10-CM | POA: Diagnosis not present

## 2021-11-29 DIAGNOSIS — E119 Type 2 diabetes mellitus without complications: Secondary | ICD-10-CM | POA: Diagnosis not present

## 2021-11-29 DIAGNOSIS — E785 Hyperlipidemia, unspecified: Secondary | ICD-10-CM | POA: Diagnosis not present

## 2021-11-29 LAB — BASIC METABOLIC PANEL
Anion gap: 8 (ref 5–15)
BUN: 13 mg/dL (ref 8–23)
CO2: 21 mmol/L — ABNORMAL LOW (ref 22–32)
Calcium: 8.3 mg/dL — ABNORMAL LOW (ref 8.9–10.3)
Chloride: 111 mmol/L (ref 98–111)
Creatinine, Ser: 0.84 mg/dL (ref 0.44–1.00)
GFR, Estimated: >60 mL/min (ref >60)
Glucose, Bld: 192 mg/dL — ABNORMAL HIGH (ref 70–99)
Potassium: 3.8 mmol/L (ref 3.5–5.1)
Sodium: 140 mmol/L (ref 135–145)

## 2021-11-29 LAB — CBG MONITORING, ED: Glucose-Capillary: 210 mg/dL — ABNORMAL HIGH (ref 70–99)

## 2021-11-29 LAB — CBC
HCT: 39.4 % (ref 36.0–46.0)
Hemoglobin: 12.3 g/dL (ref 12.0–15.0)
MCH: 30.2 pg (ref 26.0–34.0)
MCHC: 31.2 g/dL (ref 30.0–36.0)
MCV: 96.8 fL (ref 80.0–100.0)
Platelets: 211 10*3/uL (ref 150–400)
RBC: 4.07 MIL/uL (ref 3.87–5.11)
RDW: 13.9 % (ref 11.5–15.5)
WBC: 7.8 10*3/uL (ref 4.0–10.5)
nRBC: 0 % (ref 0.0–0.2)

## 2021-11-29 LAB — STREP PNEUMONIAE URINARY ANTIGEN: Strep Pneumo Urinary Antigen: NEGATIVE

## 2021-11-29 LAB — C-REACTIVE PROTEIN: CRP: 9.8 mg/dL — ABNORMAL HIGH (ref <1.0)

## 2021-11-29 LAB — HIV ANTIBODY (ROUTINE TESTING W REFLEX): HIV Screen 4th Generation wRfx: NONREACTIVE

## 2021-11-29 MED ORDER — NIRMATRELVIR/RITONAVIR (PAXLOVID)TABLET: 3.0000 | ORAL_TABLET | Freq: Two times a day (BID) | ORAL | 0 refills | Status: AC

## 2021-11-29 MED ORDER — DM-GUAIFENESIN ER 30-600 MG PO TB12: 1.0000 | ORAL_TABLET | Freq: Two times a day (BID) | ORAL | 0 refills | Status: DC | PRN

## 2021-11-29 MED ORDER — CARVEDILOL 6.25 MG PO TABS
25.0000 mg | ORAL_TABLET | Freq: Two times a day (BID) | ORAL | Status: DC
  Administered 2021-11-29: 25 mg via ORAL
  Filled 2021-11-29: qty 4

## 2021-11-29 MED ORDER — LOSARTAN POTASSIUM 50 MG PO TABS
100.0000 mg | ORAL_TABLET | Freq: Every day | ORAL | Status: DC
  Administered 2021-11-29: 100 mg via ORAL
  Filled 2021-11-29: qty 2

## 2021-11-29 MED ORDER — HYDROCHLOROTHIAZIDE 25 MG PO TABS
25.0000 mg | ORAL_TABLET | Freq: Every day | ORAL | Status: DC
  Administered 2021-11-29: 25 mg via ORAL
  Filled 2021-11-29: qty 1

## 2021-11-29 NOTE — Discharge Summary (Signed)
Physician Discharge Summary   Patient: Olivia Schroeder MRN: [id number] DOB: Jul 07, 1960  Admit date:     11/28/2021  Discharge date: 11/29/21  Discharge Physician: Enedina Finner   PCP: [facility]   Recommendations at discharge:    F/u PCP as needed in 1-2 weeks  Discharge Diagnoses: Principal Problem:   Acute respiratory disease due to COVID-19 virus Active Problems:   Sepsis (HCC)   Hypertension   Diabetes mellitus without complication (HCC)   Cirrhosis of liver (HCC)   Morbid obesity with body mass index (BMI) of 50.0 to 59.9 in adult (HCC)   HLD (hyperlipidemia)   Acute hypoxemic respiratory failure [facility] [place] [facility])   Hospital Course:  Olivia Schroeder is a 61 y.o. female with medical history significant of hypertension, hyperlipidemia, diabetes mellitus, liver cirrhosis, depression, morbid obesity with BMI 51.84, who presents with fever, use, cough, shortness of breath.  Acute respiratory disease due to COVID-19 virus Acute respiratory disease and sepsis due to COVID-19 infection: Patient meets criteria for sepsis with heart rate up to 150 and fever 101.6.  Lactic acid normal 1.5.  COVID test positive.  Patient also has leukocytosis with WBC 12.0 --pt received empiric abx -- patient overall feels a lot better. Breathing comfortably. Sats 95% on room air with ambulation. No fever. No respiratory distress.  -- Calcitonin negative. Will discontinue antibiotic.  -Paxlovid - Solu-Medrol 60 mg twice daily-- no wheezing. No history of COPD or asthma. DC steroid -- S. pneumococcal antigen--negative - Follow up blood culture x2 negative --- received IV fluids. -- Sepsis improved   Sepsis (HCC) -see above   Hypertension -- blood pressure stable. Resume home meds   Diabetes mellitus without complication (HCC) Recent A1c 6.3, well controlled.  Patient states that she is not taking Ozempic at home -Sliding scale insulin   Cirrhosis of liver (HCC) Mental status normal    Morbid obesity with body mass index (BMI) of 50.0 to 59.9 in adult (HCC)   BMI= 51.84   and BW= 137kg -Diet and exercise.   -Encouraged to lose weight.    HLD (hyperlipidemia) -lipitor   overall patient has improved. She is hemodynamically stable. Well. Agreeable for discharge. Will discharged to home with outpatient follow-up PCP as needed. Patient is in agreement               DVT ppx:  SQ Lovenox   Code Status: Full code     Disposition: Home Diet recommendation:  Discharge Diet Orders (From admission, onward)     Start     Ordered   11/29/21 0000  Diet - low sodium heart healthy        11/29/21 1117           Cardiac and Carb modified diet DISCHARGE MEDICATION: Allergies as of 11/29/2021       Reactions   Penicillins Hives   Sulfa Antibiotics Hives        Medication List     TAKE these medications    Ascorbic Acid 500 MG Chew Chew 500-1,000 mg by mouth daily.   atorvastatin 40 MG tablet Commonly known as: LIPITOR Take 40 mg by mouth every evening.   carvedilol 25 MG tablet Commonly known as: COREG Take 25 mg by mouth 2 (two) times daily.   dextromethorphan-guaiFENesin 30-600 MG 12hr tablet Commonly known as: MUCINEX DM Take 1 tablet by mouth 2 (two) times daily as needed for cough.   escitalopram 20 MG tablet Commonly known as: LEXAPRO Take 20  mg by mouth daily.   hydrochlorothiazide 25 MG tablet Commonly known as: HYDRODIURIL Take 25 mg by mouth daily.   hydrocortisone 2.5 % cream Apply 1 Application topically 2 (two) times daily as needed (itchy skin).   ketoconazole 2 % cream Commonly known as: NIZORAL Apply 1 Application topically 2 (two) times daily as needed for irritation.   lidocaine 5 % ointment Commonly known as: XYLOCAINE Apply 1 Application topically daily as needed for mild pain.   losartan 100 MG tablet Commonly known as: COZAAR Take 100 mg by mouth daily.   metroNIDAZOLE 0.75 % gel Commonly known as:  METROGEL Apply 1 Application topically 2 (two) times daily as needed (topical infection).   nirmatrelvir/ritonavir EUA 20 x 150 MG & 10 x 100MG  Tabs Commonly known as: PAXLOVID Take 3 tablets by mouth 2 (two) times daily for 5 days. Take nirmatrelvir (150 mg) two tablets twice daily for 5 days and ritonavir (100 mg) one tablet twice daily for 5 days.   promethazine-dextromethorphan 6.25-15 MG/5ML syrup Commonly known as: PROMETHAZINE-DM Take 5 mLs by mouth every 6 (six) hours as needed for cough.   Semaglutide (2 MG/DOSE) 8 MG/3ML Sopn Inject 2 mg into the skin every Sunday.   triamcinolone ointment 0.1 % Commonly known as: KENALOG Apply 1 Application topically 2 (two) times daily as needed (skin irritation).   ursodiol 500 MG tablet Commonly known as: ACTIGALL Take 1,000 mg by mouth 2 (two) times daily.   Vitamin D3 25 MCG (1000 UT) Chew Chew 1,000 Units by mouth daily.        Follow-up Information     [place], [facility]. Go in 1 week(s).   Why: As needed, If symptoms worsen Contact information: [address] [place] [phone]                Discharge Exam: [phone] Weights   11/28/21 1150  Weight: (!) 137 kg     Condition at discharge: fair  The results of significant diagnostics from this hospitalization (including imaging, microbiology, ancillary and laboratory) are listed below for reference.   Imaging Studies: DG Chest 2 View  Result Date: 11/28/2021 CLINICAL DATA:  Cough and fever EXAM: CHEST - 2 VIEW COMPARISON:  Chest x-ray June 19, 2021 FINDINGS: The cardiomediastinal silhouette is unchanged in contour. Left basilar pulmonary opacity. No focal right pulmonary opacity. No pleural effusion or pneumothorax. Visualized upper abdomen is unremarkable. No acute osseous abnormality. IMPRESSION: Left basilar pulmonary opacity, possibly atelectasis, aspiration, or infection. Electronically Signed   By: Jun 21, 2021 M.D.   On:  11/28/2021 12:46    Microbiology: Results for orders placed or performed during the hospital encounter of 11/28/21  SARS Coronavirus 2 by RT PCR (hospital order, performed in [facility] hospital lab) *cepheid single result test* Anterior Nasal Swab     Status: Abnormal   Collection Time: 11/28/21 12:11 PM   Specimen: Anterior Nasal Swab  Result Value Ref Range Status   SARS Coronavirus 2 by RT PCR POSITIVE (A) NEGATIVE Final    Comment: (NOTE) SARS-CoV-2 target nucleic acids are DETECTED  SARS-CoV-2 RNA is generally detectable in upper respiratory specimens  during the acute phase of infection.  Positive results are indicative  of the presence of the identified virus, but do not rule out bacterial infection or co-infection with other pathogens not detected by the test.  Clinical correlation with patient history and  other diagnostic information is necessary to determine patient infection status.  The expected  result is negative.  Fact Sheet for Patients:   https://www.Saket Hellstrom.info/   Fact Sheet for Healthcare Providers:   https://hall.com/    This test is not yet approved or cleared by the [place] FDA and  has been authorized for detection and/or diagnosis of SARS-CoV-2 by FDA under an Emergency Use Authorization (EUA).  This EUA will remain in effect (meaning this test can be used) for the duration of  the COVID-19 declaration under Section 564(b)(1)  of the Act, 21 U.S.C. section 360-bbb-3(b)(1), unless the authorization is terminated or revoked sooner.   Performed at [facility], [address] [place], [place] [address]   Culture, blood (x 2)     Status: None (Preliminary result)   Collection Time: 11/28/21  5:50 PM   Specimen: BLOOD  Result Value Ref Range Status   Specimen Description BLOOD LEFT HAND  Final   Special Requests   Final    BOTTLES DRAWN AEROBIC AND ANAEROBIC Blood Culture adequate volume    Culture   Final    NO GROWTH < 12 HOURS Performed at [facility], [address] [place], [place] [address]    Report Status PENDING  Incomplete  Culture, blood (x 2)     Status: None (Preliminary result)   Collection Time: 11/28/21  5:50 PM   Specimen: BLOOD  Result Value Ref Range Status   Specimen Description BLOOD RIGHT ARM  Final   Special Requests   Final    BOTTLES DRAWN AEROBIC AND ANAEROBIC Blood Culture results may not be optimal due to an excessive volume of blood received in culture bottles   Culture   Final    NO GROWTH < 12 HOURS Performed at [facility], [address] [place],  [address]    Report Status PENDING  Incomplete    Labs: CBC: Recent Labs  Lab 11/28/21 1239 11/29/21 0558  WBC 12.0* 7.8  NEUTROABS 10.6*  --   HGB 14.1 12.3  HCT 44.8 39.4  MCV 95.7 96.8  PLT 244 867   Basic Metabolic Panel: Recent Labs  Lab 11/28/21 1239 11/28/21 1243 11/29/21 0558  NA 137  --  140  K 4.5  --  3.8  CL 102  --  111  CO2 26  --  21*  GLUCOSE 170*  --  192*  BUN 20  --  13  CREATININE 0.99  --  0.84  CALCIUM 10.0  --  8.3*  MG  --  1.9  --    Liver Function Tests: Recent Labs  Lab 11/28/21 1239  AST 31  ALT 34  ALKPHOS 108  BILITOT 0.9  PROT 8.4*  ALBUMIN 4.2   CBG: Recent Labs  Lab 11/28/21 2240 11/29/21 0835  GLUCAP 125* 210*    Discharge time spent: greater than 30 minutes.  Signed: Fritzi Mandes, MD [facility] 11/29/2021

## 2021-11-29 NOTE — Care Management Obs Status (Signed)
[facility] NOTIFICATION   Patient Details  Name: Olivia Schroeder MRN: [id number] Date of Birth: December 28, 1960   Medicare Observation Status Notification Given:  Yes    Beverly Sessions, RN 11/29/2021, 12:00 PM

## 2021-11-29 NOTE — Care Management CC44 (Signed)
Condition Code 44 Documentation Completed  Patient Details  Name: Olivia Schroeder MRN: [id number] Date of Birth: 02/16/1961   Condition Code 44 given:  Yes Patient signature on Condition Code 44 notice:  Yes Documentation of 2 MD's agreement:  Yes Code 44 added to claim:  Yes    Beverly Sessions, RN 11/29/2021, 12:00 PM

## 2021-11-30 LAB — LEGIONELLA PNEUMOPHILA SEROGP 1 UR AG: L. pneumophila Serogp 1 Ur Ag: NEGATIVE

## 2021-12-03 LAB — CULTURE, BLOOD (ROUTINE X 2)
Culture: NO GROWTH
Culture: NO GROWTH
Special Requests: ADEQUATE

## 2021-12-09 ENCOUNTER — Other Ambulatory Visit: Payer: Self-pay

## 2021-12-09 ENCOUNTER — Emergency Department: Payer: Medicare HMO

## 2021-12-09 ENCOUNTER — Emergency Department
Admission: EM | Admit: 2021-12-09 | Discharge: 2021-12-09 | Disposition: A | Discharge status: Home / Self Care | Payer: Medicare HMO | Attending: Emergency Medicine | Admitting: Emergency Medicine

## 2021-12-09 DIAGNOSIS — E119 Type 2 diabetes mellitus without complications: Secondary | ICD-10-CM | POA: Diagnosis not present

## 2021-12-09 DIAGNOSIS — I10 Essential (primary) hypertension: Secondary | ICD-10-CM | POA: Diagnosis not present

## 2021-12-09 DIAGNOSIS — E041 Nontoxic single thyroid nodule: Secondary | ICD-10-CM | POA: Diagnosis not present

## 2021-12-09 DIAGNOSIS — I3139 Other pericardial effusion (noninflammatory): Secondary | ICD-10-CM | POA: Insufficient documentation

## 2021-12-09 DIAGNOSIS — R059 Cough, unspecified: Secondary | ICD-10-CM | POA: Diagnosis present

## 2021-12-09 LAB — CBC WITH DIFFERENTIAL/PLATELET
Abs Immature Granulocytes: 0.01 10*3/uL (ref 0.00–0.07)
Basophils Absolute: 0 10*3/uL (ref 0.0–0.1)
Basophils Relative: 1 %
Eosinophils Absolute: 0.2 10*3/uL (ref 0.0–0.5)
Eosinophils Relative: 3 %
HCT: 44.9 % (ref 36.0–46.0)
Hemoglobin: 14.3 g/dL (ref 12.0–15.0)
Immature Granulocytes: 0 %
Lymphocytes Relative: 35 %
Lymphs Abs: 1.8 10*3/uL (ref 0.7–4.0)
MCH: 30.2 pg (ref 26.0–34.0)
MCHC: 31.8 g/dL (ref 30.0–36.0)
MCV: 94.9 fL (ref 80.0–100.0)
Monocytes Absolute: 0.4 10*3/uL (ref 0.1–1.0)
Monocytes Relative: 8 %
Neutro Abs: 2.8 10*3/uL (ref 1.7–7.7)
Neutrophils Relative %: 53 %
Platelets: 221 10*3/uL (ref 150–400)
RBC: 4.73 MIL/uL (ref 3.87–5.11)
RDW: 13.2 % (ref 11.5–15.5)
WBC: 5.2 10*3/uL (ref 4.0–10.5)
nRBC: 0 % (ref 0.0–0.2)

## 2021-12-09 LAB — COMPREHENSIVE METABOLIC PANEL
ALT: 34 U/L (ref 0–44)
AST: 24 U/L (ref 15–41)
Albumin: 4 g/dL (ref 3.5–5.0)
Alkaline Phosphatase: 85 U/L (ref 38–126)
Anion gap: 7 (ref 5–15)
BUN: 22 mg/dL (ref 8–23)
CO2: 25 mmol/L (ref 22–32)
Calcium: 9.3 mg/dL (ref 8.9–10.3)
Chloride: 108 mmol/L (ref 98–111)
Creatinine, Ser: 0.98 mg/dL (ref 0.44–1.00)
GFR, Estimated: >60 mL/min (ref >60)
Glucose, Bld: 119 mg/dL — ABNORMAL HIGH (ref 70–99)
Potassium: 3.7 mmol/L (ref 3.5–5.1)
Sodium: 140 mmol/L (ref 135–145)
Total Bilirubin: 0.6 mg/dL (ref 0.3–1.2)
Total Protein: 7.6 g/dL (ref 6.5–8.1)

## 2021-12-09 LAB — PROCALCITONIN: Procalcitonin: <0.1 ng/mL

## 2021-12-09 LAB — TROPONIN I (HIGH SENSITIVITY)
Troponin I (High Sensitivity): 7 ng/L (ref <18)
Troponin I (High Sensitivity): 6 ng/L (ref <18)

## 2021-12-09 MED ORDER — ALBUTEROL SULFATE HFA 108 (90 BASE) MCG/ACT IN AERS: 2.0000 | INHALATION_SPRAY | RESPIRATORY_TRACT | Status: DC | PRN

## 2021-12-09 MED ORDER — IOHEXOL 350 MG/ML SOLN
100.0000 mL | Freq: Once | INTRAVENOUS | Status: AC | PRN
  Administered 2021-12-09: 100 mL via INTRAVENOUS

## 2021-12-09 NOTE — Discharge Instructions (Addendum)
You will need to follow-up with cardiology to get an echocardiogram of your heart to ensure that this fluid around your heart is not to be larger.  If you develop shortness of breath that is worsening please return to the ER for recheck of your heart.  There are no signs of any blood clots.  You need to schedule a ultrasound of your thyroid with your primary care doctor.  Return to the ER for worsening symptoms or any other concerns.  Your blood pressure was elevated today and this should be rechecked with your primary care doctor if it still remains elevated you may need to have your blood pressure medicines adjusted.  There is no evidence of pulmonary artery embolism. There is no  evidence of thoracic aortic dissection. There are few tiny scattered  coronary artery calcifications. Minimal pericardial effusion.    Linear densities are seen in both lower lung fields suggesting  scarring or subsegmental atelectasis. There is no focal pulmonary  consolidation. There is no pleural effusion.    Fatty liver. Small hiatal hernia. Surgical staples in stomach may  suggest previous bariatric surgery.    There is 2.4 cm low-density nodule in the right lobe of thyroid.    Recommend non-emergent thyroid ultrasound

## 2021-12-09 NOTE — ED Provider Notes (Signed)
[facility] Provider Note    Event Date/Time   First MD Initiated Contact with Patient 12/09/21 1406     (approximate)   History   Cough and Shortness of Breath   HPI  Olivia Schroeder is a 61 y.o. female with history of COVID-19 treated with Solu-Medrol, diabetes, cirrhosis of the liver recent treatment with Paxlovid who comes in with cough and shortness of breath.  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  She reports that she was initially diagnosed with COVID on 10/8.  She then went out of quarantine on 10/14 and was feeling much better up until yesterday she developed return of the cough congestion and some shortness of breath with ambulation.  She denies any swelling in her legs, calf tenderness.  Physical Exam   Triage Vital Signs: ED Triage Vitals  Enc Vitals Group     BP 12/09/21 1146 (!) 198/92     Pulse Rate 12/09/21 1152 94     Resp 12/09/21 1146 20     Temp 12/09/21 1146 97.7 F (36.5 C)     Temp src --      SpO2 12/09/21 1146 100 %     Weight 12/09/21 1148 293 lb 3.4 oz (133 kg)     Height 12/09/21 1148 5\' 4"  (1.626 m)     Head Circumference --      Peak Flow --      Pain Score 12/09/21 1148 0     Pain Loc --      Pain Edu? --      Excl. in [place]? --     Most recent vital signs: Vitals:   12/09/21 1146 12/09/21 1152  BP: (!) 198/92   Pulse:  94  Resp: 20   Temp: 97.7 F (36.5 C)   SpO2: 100%      General: Awake, no distress.  CV:  Good peripheral perfusion.  Resp:  Normal effort.  Clear lungs no wheezing Abd:  No distention.  Soft and nontender Other:  No swelling the legs.  No calf tenderness   ED Results / Procedures / Treatments   Labs (all labs ordered are listed, but only abnormal results are displayed) Labs Reviewed  COMPREHENSIVE METABOLIC PANEL - Abnormal; Notable for the following components:      Result Value   Glucose, Bld 119  (*)    All other components within normal limits  CBC WITH DIFFERENTIAL/PLATELET  TROPONIN I (HIGH SENSITIVITY)     EKG  My interpretation of EKG:  Normal sinus rate of 83 without any ST elevation or T wave inversions, first-degree AV block  RADIOLOGY I have reviewed the xray personally and interpreted no evidence of focal consolidation  PROCEDURES:  Critical Care performed: No  Procedures   MEDICATIONS ORDERED IN ED: Medications  albuterol (VENTOLIN HFA) 108 (90 Base) MCG/ACT inhaler 2 puff (has no administration in time range)  iohexol (OMNIPAQUE) 350 MG/ML injection 100 mL (100 mLs Intravenous Contrast Given 12/09/21 1539)     IMPRESSION / MDM / ASSESSMENT AND PLAN / ED COURSE  I reviewed the triage vital signs and the nursing notes.   Patient's presentation is most consistent with acute presentation with potential threat to life or bodily function.   Patient comes in with continued shortness of breath and cough now 11 days post COVID.  I suspect that this could be COVID flaring back up due to  patient being immunosuppressed and being on Paxlovid.  We discussed a 21-day quarantine due to the concern that she could still be infected.  Her oxygen level looks good however does not require oxygen.  Given she does report increased labored breathing with ambulation will get CT to make sure no evidence of pulmonary embolism, pneumonia, labs to evaluate for ACS.  Troponins are negative x2.  Procalcitonin negative therefore no bacterial infection.  CMP shows normal kidney function   CT imaging overall reassuring does have some incidental findings however no PE.  There is a minimal pericardial effusion.  Incidental findings were discussed with patient of the thyroid nodule patient provided copy of report.  Given minimal pericardial effusion I do feel the patient will need a echocardiogram done outpatient to ensure that this is resolving and not getting worse.  Discussed with  patient returning if she develops worsening chest pain or shortness of breath.  Discussed with Dr. Ileana Ladd from [facility] and agree that patient can follow-up outpatient at for echocardiogram.  I had extensive conversation with patient about return precautions in regards to shortness of breath to ensure this is not getting larger but at this time given stable vital signs this is unlikely to be the cause of patient's shortness of breath here and cardiology agrees with outpatient follow-up.  Patient did have uncontrolled hypertension noted on today's visit but she reports being compliant with her blood pressure medicines and she feels like it is just related to being sick and being back at the hospital.  She is going to follow-up for recheck with her primary care doctor to see if her medications need to be adjusted.  FINAL CLINICAL IMPRESSION(S) / ED DIAGNOSES   Final diagnoses:  Thyroid nodule  Pericardial effusion  Uncontrolled hypertension     Rx / DC Orders   ED Discharge Orders          Ordered    Ambulatory referral to Cardiology        12/09/21 1633             Note:  This document was prepared using Dragon voice recognition software and may include unintentional dictation errors.   Vanessa [place], MD 12/09/21 1723

## 2021-12-09 NOTE — ED Triage Notes (Signed)
Pt states she was diagnosed with covid and pneumonia on 10/8 and was admitted for that here. Pt states she has similar symptoms with cough. Pt states at times she also feels out of breath, but feel like it is due to the congestion. Pt seen at PCP today and pt has history of autoimmune diseases.

## 2021-12-18 NOTE — Progress Notes (Signed)
As I mentioned in the note she had a differential with a lot of polys.  It is possible she had COVID and a bacterial pneumonia on top of it that was the idea of ordering the procalcitonin.

## 2021-12-23 ENCOUNTER — Telehealth: Payer: Self-pay | Admitting: Cardiology

## 2021-12-23 DIAGNOSIS — R0600 Dyspnea, unspecified: Secondary | ICD-10-CM

## 2021-12-23 NOTE — Telephone Encounter (Signed)
-----   Message from Sheri Hammock, NP sent at 12/22/2021 10:23 AM EDT ----- Regarding: RE: Echocardiogram Hey Olivia Schroeder. She was recently in the hospital at [facility]. We didn't see her then either. I just had a message to have her scheduled for an echo then a follow up. Sorry wish I more info for you. Thanks, Sheri ----- Message ----- From: Kronbergs, Olivia Schroeder L Sent: 12/22/2021   9:01 AM EDT To: Sheri Hammock, NP Subject: RE: Echocardiogram                             This patient is scheduled as a NP with Dr. Gollan in December. Has she been seen by our group before? I couldn't locate where she was seen before. ----- Message ----- From: Hammock, Sheri, NP Sent: 12/10/2021   7:38 AM EDT To: [facility] Scheduling Subject: Echocardiogram                                 Can we please schedule her for an echocardiogram and then an office visit after her test is completed please and thank you    

## 2021-12-23 NOTE — Telephone Encounter (Signed)
Lvm to schedule echo

## 2021-12-24 NOTE — Telephone Encounter (Signed)
-----   Message from Gerrie Nordmann, NP sent at 12/22/2021 10:23 AM EDT ----- Regarding: RE: Echocardiogram Hey Sabrina. She was recently in the hospital at [facility]. We didn't see her then either. I just had a message to have her scheduled for an echo then a follow up. Sorry wish I more info for you. Thanks, Barbera Setters ----- Message ----- From: Eli Phillips Sent: 12/22/2021   9:01 AM EDT To: Gerrie Nordmann, NP Subject: RE: Echocardiogram                             This patient is scheduled as a NP with Dr. Rockey Situ in December. Has she been seen by our group before? I couldn't locate where she was seen before. ----- Message ----- From: Gerrie Nordmann, NP Sent: 12/10/2021   7:38 AM EDT To: [facility] Scheduling Subject: Echocardiogram                                 Can we please schedule her for an echocardiogram and then an office visit after her test is completed please and thank you

## 2021-12-24 NOTE — Telephone Encounter (Signed)
Lvm to schedule echo

## 2021-12-28 NOTE — Telephone Encounter (Signed)
Order has been placed.

## 2021-12-28 NOTE — Telephone Encounter (Signed)
-----   Message from Eli Phillips sent at 12/28/2021 12:57 PM EST ----- Regarding: RE: Echocardiogram Your nurse can do it :)  ----- Message ----- From: Gerrie Nordmann, NP Sent: 12/28/2021  10:41 AM EST To: Eli Phillips Subject: RE: Echocardiogram                             Sooo how do I do that? ----- Message ----- From: Eli Phillips Sent: 12/23/2021   2:27 PM EST To: Gerrie Nordmann, NP Subject: RE: Echocardiogram                             Ok, Just need an order entered. ----- Message ----- From: Gerrie Nordmann, NP Sent: 12/22/2021  10:25 AM EDT To: Eli Phillips Subject: RE: Echocardiogram                             Hey Sabrina. She was recently in the hospital at [facility]. We didn't see her then either. I just had a message to have her scheduled for an echo then a follow up. Sorry wish I more info for you. Thanks, Barbera Setters ----- Message ----- From: Eli Phillips Sent: 12/22/2021   9:01 AM EDT To: Gerrie Nordmann, NP Subject: RE: Echocardiogram                             This patient is scheduled as a NP with Dr. Rockey Situ in December. Has she been seen by our group before? I couldn't locate where she was seen before. ----- Message ----- From: Gerrie Nordmann, NP Sent: 12/10/2021   7:38 AM EDT To: [facility] Scheduling Subject: Echocardiogram                                 Can we please schedule her for an echocardiogram and then an office visit after her test is completed please and thank you

## 2022-01-06 ENCOUNTER — Ambulatory Visit (HOSPITAL_COMMUNITY): Payer: Medicare HMO | Attending: Internal Medicine

## 2022-01-06 DIAGNOSIS — R0600 Dyspnea, unspecified: Secondary | ICD-10-CM | POA: Diagnosis present

## 2022-01-06 LAB — ECHOCARDIOGRAM COMPLETE: S' Lateral: 3.5 cm

## 2022-01-07 NOTE — Progress Notes (Signed)
Heart function remains strong. No issues with movement or the valves. Reassuring study.

## 2022-02-01 NOTE — Progress Notes (Unsigned)
Cardiology Office Note  Date:  02/02/2022   ID:  DEMIE DERUYTER, DOB Apr 10, 1960, MRN [id number]  PCP:  Sherre Scarlet, PA-C   Chief Complaint  Patient presents with   New Patient (Initial Visit)    Ref by Dr. Marjean Donna from [facility] ER. Patient had a CT scan showing a pericardial effusion. Medications reviewed by the patient verbally.        HPI:  Olivia Schroeder is a 61 year old woman with past medical history of Morbid obesity Diabetes type 2 Hyperlipidemia Liver cirrhosis nonsmoker COVID infection October 2023 Who presents by referral from Dr. Loraine Leriche in the ER for consultation of her pericardial effusion on CT scan, COVID infection  Recent hospitalization November 29 2021 for COVID-19 acute respiratory distress, sepsis Treated with steroids, Paxlovid  Presented back to the emergency room December 09, 2021, relapse of COVID symptoms CT scan chest December 09, 2021, minimal pericardial effusion noted Request made through our office for echocardiogram to follow-up on effusion  CT scan images pulled up and reviewed, minimal pericardial effusion noted, mild aortic atherosclerosis noted off the aortic arch  Echocardiogram January 06, 2022, essentially normal study, no significant pericardial effusion noted  Currently feels back to her baseline, denies significant shortness of breath on exertion  Lab work reviewed Aq1C 6.1, improving numbers on Ozempic Total cholesterol 225, on Lipitor 40  EKG personally reviewed by myself on todays visit Normal sinus rhythm rate 76 bpm no significant ST-T wave changes   PMH:   has a past medical history of Cirrhosis of liver ([place]), Depression, Diabetes mellitus without complication ([place]), Hidradenitis, and Hypertension.  PSH:    Past Surgical History:  Procedure Laterality Date   BREAST BIOPSY Left 10/01/2014   neg   LAPAROSCOPIC GASTRIC SLEEVE RESECTION     REDUCTION MAMMAPLASTY Bilateral 2014    Current Outpatient  Medications  Medication Sig Dispense Refill   amLODipine (NORVASC) 5 MG tablet Take 5 mg by mouth daily.     Ascorbic Acid 500 MG CHEW Chew 500-1,000 mg by mouth daily.     atorvastatin (LIPITOR) 40 MG tablet Take 40 mg by mouth every evening.     carvedilol (COREG) 25 MG tablet Take 25 mg by mouth 2 (two) times daily.     Cholecalciferol (VITAMIN D3) 25 MCG (1000 UT) CHEW Chew 1,000 Units by mouth daily.     doxycycline (VIBRA-TABS) 100 MG tablet Take 100 mg by mouth 2 (two) times daily.     escitalopram (LEXAPRO) 20 MG tablet Take 20 mg by mouth daily.     hydrochlorothiazide (HYDRODIURIL) 25 MG tablet Take 25 mg by mouth daily.     hydrocortisone 2.5 % cream Apply 1 Application topically 2 (two) times daily as needed (itchy skin).     ketoconazole (NIZORAL) 2 % cream Apply 1 Application topically 2 (two) times daily as needed for irritation.     lidocaine (XYLOCAINE) 5 % ointment Apply 1 Application topically daily as needed for mild pain.     losartan (COZAAR) 100 MG tablet Take 100 mg by mouth daily.     metroNIDAZOLE (METROGEL) 0.75 % gel Apply 1 Application topically 2 (two) times daily as needed (topical infection).     promethazine-dextromethorphan (PROMETHAZINE-DM) 6.25-15 MG/5ML syrup Take 5 mLs by mouth every 6 (six) hours as needed for cough.     Semaglutide, 2 MG/DOSE, 8 MG/3ML SOPN Inject 2 mg into the skin every Sunday.     triamcinolone ointment (KENALOG) 0.1 % Apply  1 Application topically 2 (two) times daily as needed (skin irritation).     ursodiol (ACTIGALL) 500 MG tablet Take 1,000 mg by mouth 2 (two) times daily.     dextromethorphan-guaiFENesin (MUCINEX DM) 30-600 MG 12hr tablet Take 1 tablet by mouth 2 (two) times daily as needed for cough. (Patient not taking: Reported on 02/02/2022) 10 tablet 0   No current facility-administered medications for this visit.     Allergies:   Ace inhibitors, Penicillins, Sulfa antibiotics, and Nsaids   Social History:  The patient   reports that she has never smoked. She has never used smokeless tobacco. She reports that she does not drink alcohol and does not use drugs.   Family History:   family history includes Cancer in her paternal grandfather; Heart attack (age of onset: 64) in her mother; Heart disease in her mother; Hyperlipidemia in her father and mother; Hypertension in her father and mother; Stroke in her father.    Review of Systems: Review of Systems  Constitutional: Negative.   HENT: Negative.    Respiratory: Negative.    Cardiovascular: Negative.   Gastrointestinal: Negative.   Musculoskeletal: Negative.   Neurological: Negative.   Psychiatric/Behavioral: Negative.    All other systems reviewed and are negative.    PHYSICAL EXAM: VS:  BP (!) 140/92 (BP Location: Right Arm, Patient Position: Sitting, Cuff Size: Large)   Pulse 76   Ht 5\' 4"  (1.626 m)   Wt 292 lb 4 oz (132.6 kg)   SpO2 98%   BMI 50.16 kg/m  , BMI Body mass index is 50.16 kg/m. GEN: Well nourished, well developed, in no acute distress HEENT: normal Neck: no JVD, carotid bruits, or masses Cardiac: RRR; no murmurs, rubs, or gallops,no edema  Respiratory:  clear to auscultation bilaterally, normal work of breathing GI: soft, nontender, nondistended, + BS MS: no deformity or atrophy Skin: warm and dry, no rash Neuro:  Strength and sensation are intact Psych: euthymic mood, full affect   Recent Labs: 11/28/2021: Magnesium 1.9 12/09/2021: ALT 34; BUN 22; Creatinine, Ser 0.98; Hemoglobin 14.3; Platelets 221; Potassium 3.7; Sodium 140    Lipid Panel No results found for: "CHOL", "HDL", "LDLCALC", "TRIG"    Wt Readings from Last 3 Encounters:  02/02/22 292 lb 4 oz (132.6 kg)  12/09/21 293 lb 3.4 oz (133 kg)  11/28/21 (!) 302 lb 0.5 oz (137 kg)       ASSESSMENT AND PLAN:  Problem List Items Addressed This Visit       Cardiology Problems   Hypertension   Relevant Medications   amLODipine (NORVASC) 5 MG tablet    HLD (hyperlipidemia)   Relevant Medications   amLODipine (NORVASC) 5 MG tablet     Other   Cirrhosis of liver (HCC)   Diabetes mellitus without complication (HCC)   Other Visit Diagnoses     Dyspnea, unspecified type    -  Primary      Aortic atherosclerosis Noted on CT scan chest Stressed the importance of aggressive diabetes and cholesterol control Cholesterol remains elevated on Lipitor 40 daily We will add Zetia 10 mg daily If numbers continue to run high may need to initiate SGLT2 inhibitor  Diabetes type 2 We have encouraged continued exercise, careful diet management in an effort to lose weight. Numbers improving on Ozempic  Pericardial effusion In the setting of COVID-19 infection noted on CT scan No significant effusion noted 1 month later on echocardiogram, no further workup needed  Essential hypertension Blood pressure elevated  in the office today, recommend close monitor Amlodipine could be increased to 10 mg daily if needed   Total encounter time more than 50 minutes  Greater than 50% was spent in counseling and coordination of care with the patient  Patient was seen in consultation for Dr. Jari Pigg and will be referred back to her office for ongoing care of the issues detailed above  Signed, Esmond Plants, M.D., Ph.D. [place], [place]

## 2022-02-02 ENCOUNTER — Ambulatory Visit: Payer: Medicare HMO | Attending: Cardiovascular Disease | Admitting: Cardiovascular Disease

## 2022-02-02 ENCOUNTER — Encounter: Payer: Self-pay | Admitting: Cardiovascular Disease

## 2022-02-02 VITALS — BP 140/92 | HR 76 | Ht 64.0 in | Wt 292.2 lb

## 2022-02-02 DIAGNOSIS — K7460 Unspecified cirrhosis of liver: Secondary | ICD-10-CM

## 2022-02-02 DIAGNOSIS — E782 Mixed hyperlipidemia: Secondary | ICD-10-CM | POA: Diagnosis not present

## 2022-02-02 DIAGNOSIS — I10 Essential (primary) hypertension: Secondary | ICD-10-CM

## 2022-02-02 DIAGNOSIS — R0600 Dyspnea, unspecified: Secondary | ICD-10-CM | POA: Diagnosis not present

## 2022-02-02 DIAGNOSIS — E119 Type 2 diabetes mellitus without complications: Secondary | ICD-10-CM

## 2022-02-02 DIAGNOSIS — I700 Atherosclerosis of aorta: Secondary | ICD-10-CM

## 2022-02-02 MED ORDER — EZETIMIBE 10 MG PO TABS: 10.0000 mg | ORAL_TABLET | Freq: Every day | ORAL | 3 refills | Status: DC

## 2022-02-02 NOTE — Patient Instructions (Addendum)
Medication Instructions:  Please start zetia 10 mg daily for cholesterol  If you need a refill on your cardiac medications before your next appointment, please call your pharmacy.   Lab work: No new labs needed  Testing/Procedures: No new testing needed  Follow-Up: At [facility], you and your health needs are our priority.  As part of our continuing mission to provide you with exceptional heart care, we have created designated Provider Care Teams.  These Care Teams include your primary Cardiologist (physician) and Advanced Practice Providers (APPs -  Physician Assistants and Nurse Practitioners) who all work together to provide you with the care you need, when you need it.  You will need a follow up appointment as needed  Providers on your designated Care Team:   Nicolasa Ducking, NP Eula Listen, PA-C Cadence Fransico [place], [place]  COVID-19 Vaccine Information can be found at: [email or web address] For questions related to vaccine distribution or appointments, please email vaccine@[email or web address] or call [phone].

## 2022-02-21 DIAGNOSIS — J189 Pneumonia, unspecified organism: Secondary | ICD-10-CM

## 2022-02-21 HISTORY — DX: Pneumonia, unspecified organism: J18.9

## 2022-10-30 ENCOUNTER — Emergency Department
Admission: EM | Admit: 2022-10-30 | Discharge: 2022-10-30 | Disposition: A | Discharge status: Home / Self Care | Payer: Medicare HMO | Attending: Emergency Medicine | Admitting: Emergency Medicine

## 2022-10-30 ENCOUNTER — Emergency Department: Payer: Medicare HMO

## 2022-10-30 ENCOUNTER — Other Ambulatory Visit: Payer: Self-pay

## 2022-10-30 DIAGNOSIS — Z20822 Contact with and (suspected) exposure to covid-19: Secondary | ICD-10-CM | POA: Diagnosis not present

## 2022-10-30 DIAGNOSIS — B349 Viral infection, unspecified: Secondary | ICD-10-CM | POA: Insufficient documentation

## 2022-10-30 DIAGNOSIS — E119 Type 2 diabetes mellitus without complications: Secondary | ICD-10-CM | POA: Insufficient documentation

## 2022-10-30 DIAGNOSIS — M791 Myalgia, unspecified site: Secondary | ICD-10-CM | POA: Diagnosis present

## 2022-10-30 DIAGNOSIS — I10 Essential (primary) hypertension: Secondary | ICD-10-CM | POA: Diagnosis not present

## 2022-10-30 LAB — COMPREHENSIVE METABOLIC PANEL
ALT: 29 U/L (ref 0–44)
AST: 41 U/L (ref 15–41)
Albumin: 4.3 g/dL (ref 3.5–5.0)
Alkaline Phosphatase: 108 U/L (ref 38–126)
Anion gap: 10 (ref 5–15)
BUN: 27 mg/dL — ABNORMAL HIGH (ref 8–23)
CO2: 22 mmol/L (ref 22–32)
Calcium: 9.3 mg/dL (ref 8.9–10.3)
Chloride: 105 mmol/L (ref 98–111)
Creatinine, Ser: 1.23 mg/dL — ABNORMAL HIGH (ref 0.44–1.00)
GFR, Estimated: 50 mL/min — ABNORMAL LOW (ref >60)
Glucose, Bld: 222 mg/dL — ABNORMAL HIGH (ref 70–99)
Potassium: 3.9 mmol/L (ref 3.5–5.1)
Sodium: 137 mmol/L (ref 135–145)
Total Bilirubin: 1.8 mg/dL — ABNORMAL HIGH (ref 0.3–1.2)
Total Protein: 8.5 g/dL — ABNORMAL HIGH (ref 6.5–8.1)

## 2022-10-30 LAB — URINALYSIS, ROUTINE W REFLEX MICROSCOPIC
Bilirubin Urine: NEGATIVE
Glucose, UA: NEGATIVE mg/dL
Ketones, ur: NEGATIVE mg/dL
Nitrite: NEGATIVE
Protein, ur: NEGATIVE mg/dL
Specific Gravity, Urine: 1.02 (ref 1.005–1.030)
pH: 5 (ref 5.0–8.0)

## 2022-10-30 LAB — CBC WITH DIFFERENTIAL/PLATELET
Abs Immature Granulocytes: 0.03 10*3/uL (ref 0.00–0.07)
Basophils Absolute: 0.1 10*3/uL (ref 0.0–0.1)
Basophils Relative: 0 %
Eosinophils Absolute: 0.1 10*3/uL (ref 0.0–0.5)
Eosinophils Relative: 1 %
HCT: 47 % — ABNORMAL HIGH (ref 36.0–46.0)
Hemoglobin: 15.1 g/dL — ABNORMAL HIGH (ref 12.0–15.0)
Immature Granulocytes: 0 %
Lymphocytes Relative: 6 %
Lymphs Abs: 0.7 10*3/uL (ref 0.7–4.0)
MCH: 29.8 pg (ref 26.0–34.0)
MCHC: 32.1 g/dL (ref 30.0–36.0)
MCV: 92.7 fL (ref 80.0–100.0)
Monocytes Absolute: 0.5 10*3/uL (ref 0.1–1.0)
Monocytes Relative: 4 %
Neutro Abs: 10.1 10*3/uL — ABNORMAL HIGH (ref 1.7–7.7)
Neutrophils Relative %: 89 %
Platelets: 229 10*3/uL (ref 150–400)
RBC: 5.07 MIL/uL (ref 3.87–5.11)
RDW: 13.6 % (ref 11.5–15.5)
WBC: 11.5 10*3/uL — ABNORMAL HIGH (ref 4.0–10.5)
nRBC: 0 % (ref 0.0–0.2)

## 2022-10-30 LAB — LIPASE, BLOOD: Lipase: 33 U/L (ref 11–51)

## 2022-10-30 LAB — RESP PANEL BY RT-PCR (RSV, FLU A&B, COVID)  RVPGX2
Influenza A by PCR: NEGATIVE
Influenza B by PCR: NEGATIVE
Resp Syncytial Virus by PCR: NEGATIVE
SARS Coronavirus 2 by RT PCR: NEGATIVE

## 2022-10-30 LAB — CK: Total CK: 87 U/L (ref 38–234)

## 2022-10-30 LAB — LACTIC ACID, PLASMA
Lactic Acid, Venous: 1.7 mmol/L (ref 0.5–1.9)
Lactic Acid, Venous: 2.3 mmol/L (ref 0.5–1.9)
Lactic Acid, Venous: 0.7 mmol/L (ref 0.5–1.9)

## 2022-10-30 MED ORDER — ACETAMINOPHEN 500 MG PO TABS
1000.0000 mg | ORAL_TABLET | Freq: Once | ORAL | Status: AC
  Administered 2022-10-30: 1000 mg via ORAL
  Filled 2022-10-30: qty 2

## 2022-10-30 MED ORDER — SODIUM CHLORIDE 0.9 % IV BOLUS
1000.0000 mL | Freq: Once | INTRAVENOUS | Status: AC
  Administered 2022-10-30: 1000 mL via INTRAVENOUS

## 2022-10-30 NOTE — ED Triage Notes (Signed)
Started not feeling well last night and having chills. No fever noted. Reports having right abdominal pain a week ago

## 2022-10-30 NOTE — ED Provider Triage Note (Signed)
Emergency Medicine Provider Triage Evaluation Note  DELANNA JOBST , a 62 y.o. female  was evaluated in triage.  Pt complains of right lower quadrant pain since last week, and then last night had shaking chills before 3 hours. Happened again today. Feels achy everywhere and has a headache. No fever last night, and was 100.98F at her sons house. No dysuria. No vomiting or diarrhea. Reports her whole body feels stiff.  Review of Systems  Positive: RLQ pain, shaking chills Negative: N/v/d  Physical Exam  There were no vitals taken for this visit. Gen:   Awake, no distress   Resp:  Normal effort  MSK:   Moves extremities without difficulty  Other:    Medical Decision Making  Medically screening exam initiated at 2:22 PM.  Appropriate orders placed.  CERISSA HOUCK was informed that the remainder of the evaluation will be completed by another provider, this initial triage assessment does not replace that evaluation, and the importance of remaining in the ED until their evaluation is complete.     Jackelyn Hoehn, PA-C 10/30/22 1426

## 2022-10-30 NOTE — ED Notes (Signed)
Pt d/c home per MD order. Discharge summary reviewed, pt verbalizes understanding. No s/s of acute distress noted at discharge. Discharged home with SO.

## 2022-10-30 NOTE — ED Provider Notes (Signed)
[facility] Provider Note   Event Date/Time   First MD Initiated Contact with Patient 10/30/22 1709     (approximate) History  Generalized Body Aches (Started not feeling well last night and having chills. No fever noted. Reports having right abdominal pain a week ago)  HPI Olivia Schroeder is a 62 y.o. female with a past medical history of hyper tension, hidradenitis, cirrhosis, and type 2 diabetes who presents for vague symptoms including chills, fatigue, headache, and right-sided lower abdominal pain approximate 1 week prior to arrival.  Patient denies any recent travel or sick contacts ROS: Patient currently denies any vision changes, tinnitus, difficulty speaking, facial droop, sore throat, chest pain, shortness of breath, abdominal pain, nausea/vomiting/diarrhea, dysuria, or weakness/numbness/paresthesias in any extremity   Physical Exam  Triage Vital Signs: ED Triage Vitals  Encounter Vitals Group     BP 10/30/22 1423 (!) 161/84     Systolic BP Percentile --      Diastolic BP Percentile --      Pulse Rate 10/30/22 1423 (!) 125     Resp 10/30/22 1423 17     Temp 10/30/22 1423 98.4 F (36.9 C)     Temp Source 10/30/22 1423 Oral     SpO2 10/30/22 1423 98 %     Weight 10/30/22 1426 296 lb (134.3 kg)     Height 10/30/22 1426 5\' 4"  (1.626 m)     Head Circumference --      Peak Flow --      Pain Score 10/30/22 1425 9     Pain Loc --      Pain Education --      Exclude from Growth Chart --    Most recent vital signs: Vitals:   10/30/22 2200 10/30/22 2233  BP: 124/64   Pulse: 90   Resp: 18   Temp:  98.5 F (36.9 C)  SpO2: 96%    General: Awake, oriented x4. CV:  Good peripheral perfusion.  Resp:  Normal effort.  Abd:  No distention.  Other:  Middle-aged morbidly obese Caucasian female resting comfortably in no acute distress ED Results / Procedures / Treatments  Labs (all labs ordered are listed, but only abnormal results are displayed) Labs  Reviewed  COMPREHENSIVE METABOLIC PANEL - Abnormal; Notable for the following components:      Result Value   Glucose, Bld 222 (*)    BUN 27 (*)    Creatinine, Ser 1.23 (*)    Total Protein 8.5 (*)    Total Bilirubin 1.8 (*)    GFR, Estimated 50 (*)    All other components within normal limits  CBC WITH DIFFERENTIAL/PLATELET - Abnormal; Notable for the following components:   WBC 11.5 (*)    Hemoglobin 15.1 (*)    HCT 47.0 (*)    Neutro Abs 10.1 (*)    All other components within normal limits  URINALYSIS, ROUTINE W REFLEX MICROSCOPIC - Abnormal; Notable for the following components:   Color, Urine YELLOW (*)    APPearance HAZY (*)    Hgb urine dipstick MODERATE (*)    Leukocytes,Ua SMALL (*)    Bacteria, UA RARE (*)    All other components within normal limits  LACTIC ACID, PLASMA - Abnormal; Notable for the following components:   Lactic Acid, Venous 2.3 (*)    All other components within normal limits  RESP PANEL BY RT-PCR (RSV, FLU A&B, COVID)  RVPGX2  CULTURE, BLOOD (ROUTINE X 2)  CULTURE, BLOOD (  ROUTINE X 2)  LIPASE, BLOOD  CK  LACTIC ACID, PLASMA  LACTIC ACID, PLASMA   RADIOLOGY ED MD interpretation: One-view portable chest x-ray interpreted by me shows no evidence of acute abnormalities including no pneumonia, pneumothorax, or widened mediastinum -Agree with radiology assessment Official radiology report(s): DG Chest Port 1 View  Result Date: 10/30/2022 CLINICAL DATA:  Cough with fever EXAM: PORTABLE CHEST 1 VIEW COMPARISON:  Chest x-ray 12/09/2021.  CT of the chest 12/09/2021 FINDINGS: Left basilar atelectasis or scarring is stable. The heart is enlarged, unchanged. There is no new focal lung infiltrate, pleural effusion or pneumothorax. No acute fractures are seen. IMPRESSION: No active disease. Stable cardiomegaly. Stable left basilar atelectasis or scarring. Electronically Signed   By: Darliss Cheney M.D.   On: 10/30/2022 19:46   PROCEDURES: Critical Care  performed: No Procedures MEDICATIONS ORDERED IN ED: Medications  sodium chloride 0.9 % bolus 1,000 mL (0 mLs Intravenous Stopped 10/30/22 2118)  acetaminophen (TYLENOL) tablet 1,000 mg (1,000 mg Oral Given 10/30/22 1832)   IMPRESSION / MDM / ASSESSMENT AND PLAN / ED COURSE  I reviewed the triage vital signs and the nursing notes.                             The patient is on the cardiac monitor to evaluate for evidence of arrhythmia and/or significant heart rate changes. Patient's presentation is most consistent with acute presentation with potential threat to life or bodily function. Otherwise healthy patient presenting with constellation of symptoms likely representing uncomplicated viral illness  Unlikely PTA/RPA: no hot potato voice, no uvular deviation, Unlikely Esophageal rupture: No history of dysphagia Unlikely deep space infection/Ludwig's Low suspicion for CNS infection bacterial sinusitis, or pneumonia given exam and history.  Unlikely Strep or EBV as centor negative and with no pharyngeal exudate, posterior LAD, or splenomegaly.  Will attempt to alleviate symptoms conservatively; no overt indications at this time for antibiotics. No respiratory distress, otherwise relatively well appearing and nontoxic. Will discuss prompt follow up with PMD and strict return precautions.   FINAL CLINICAL IMPRESSION(S) / ED DIAGNOSES   Final diagnoses:  Viral illness   Rx / DC Orders   ED Discharge Orders     None      Note:  This document was prepared using Dragon voice recognition software and may include unintentional dictation errors.   Merwyn Katos, MD 10/30/22 [phone]

## 2022-10-31 NOTE — Group Note (Deleted)

## 2022-11-04 LAB — CULTURE, BLOOD (ROUTINE X 2): Culture: NO GROWTH

## 2023-02-16 ENCOUNTER — Other Ambulatory Visit: Payer: Self-pay

## 2023-02-16 ENCOUNTER — Ambulatory Visit
Admission: RE | Admit: 2023-02-16 | Discharge: 2023-02-16 | Disposition: A | Discharge status: Home / Self Care | Payer: Medicare HMO | Source: Ambulatory Visit

## 2023-02-16 VITALS — BP 161/81 | HR 86 | Temp 98.8°F | Resp 18

## 2023-02-16 DIAGNOSIS — J069 Acute upper respiratory infection, unspecified: Secondary | ICD-10-CM | POA: Diagnosis not present

## 2023-02-16 MED ORDER — BENZONATATE 100 MG PO CAPS: 100.0000 mg | ORAL_CAPSULE | Freq: Three times a day (TID) | ORAL | 0 refills | Status: DC

## 2023-02-16 MED ORDER — CEFDINIR 300 MG PO CAPS: 300.0000 mg | ORAL_CAPSULE | Freq: Two times a day (BID) | ORAL | 0 refills | Status: AC

## 2023-02-16 MED ORDER — PROMETHAZINE-DM 6.25-15 MG/5ML PO SYRP: 5.0000 mL | ORAL_SOLUTION | Freq: Every evening | ORAL | 0 refills | Status: DC | PRN

## 2023-02-16 NOTE — ED Triage Notes (Signed)
Started feeling bad last Wednesday.  Unable to get into pcp office.  Throat drainage, chest congestion, cough, sinus congestion.  Reports when she blows her knows its yellow and green.    Patient has had pneumonia in her history  Took sudafed.

## 2023-02-16 NOTE — ED Provider Notes (Signed)
[name]    CSN: [id number] Arrival date & time: 02/16/23  1759      History   Chief Complaint Chief Complaint  Patient presents with   Appointment    18:15    HPI Olivia Schroeder is a 62 y.o. female.   Patient presents for evaluation of nasal congestion, rhinorrhea and a nonproductive cough present for 8 days.  Associated intermittent bilateral ear fullness and generalized sinus pressure with sore throat.  Mucous turning from clear to green beginning this morning.  Poor appetite but tolerating some food and fluids.  Possible sick contacts that she is around small children.  Has attempted use of pseudoephedrine.  Nuys shortness of breath or wheezing.    Past Medical History:  Diagnosis Date   Cirrhosis of liver (HCC)    Depression    Diabetes mellitus without complication (HCC)    Hidradenitis    Hypertension     Patient Active Problem List   Diagnosis Date Noted   Acute hypoxemic respiratory failure (HCC) 11/29/2021   COVID-19 virus infection 11/29/2021   Acute respiratory disease due to COVID-19 virus 11/28/2021   Cirrhosis of liver (HCC)    Diabetes mellitus without complication (HCC)    Hypertension    Morbid obesity with body mass index (BMI) of 50.0 to 59.9 in adult (HCC)    Sepsis (HCC)    HLD (hyperlipidemia)     Past Surgical History:  Procedure Laterality Date   BREAST BIOPSY Left 10/01/2014   neg   LAPAROSCOPIC GASTRIC SLEEVE RESECTION     REDUCTION MAMMAPLASTY Bilateral 2014    OB History   No obstetric history on file.      Home Medications    Prior to Admission medications   Medication Sig Start Date End Date Taking? Authorizing Provider  benzonatate (TESSALON) 100 MG capsule Take 1 capsule (100 mg total) by mouth every 8 (eight) hours. 02/16/23  Yes Salli Quarry R, NP  buPROPion (WELLBUTRIN XL) 150 MG 24 hr tablet Take by mouth. 01/05/23 01/05/24 Yes [provider]  cefdinir (OMNICEF) 300 MG capsule Take 1  capsule (300 mg total) by mouth 2 (two) times daily for 7 days. 02/16/23 02/23/23 Yes Destan Franchini, Elita Boone, NP  promethazine-dextromethorphan (PROMETHAZINE-DM) 6.25-15 MG/5ML syrup Take 5 mLs by mouth at bedtime as needed for cough. 02/16/23  Yes Werner Labella R, NP  amLODipine (NORVASC) 5 MG tablet Take 5 mg by mouth daily. 12/28/21 12/28/22  [provider]  Ascorbic Acid 500 MG CHEW Chew 500-1,000 mg by mouth daily.    [provider]  atorvastatin (LIPITOR) 40 MG tablet Take 40 mg by mouth every evening.    [provider]  carvedilol (COREG) 25 MG tablet Take 25 mg by mouth 2 (two) times daily.    [provider]  Cholecalciferol (VITAMIN D3) 25 MCG (1000 UT) CHEW Chew 1,000 Units by mouth daily.    [provider]  dextromethorphan-guaiFENesin (MUCINEX DM) 30-600 MG 12hr tablet Take 1 tablet by mouth 2 (two) times daily as needed for cough. Patient not taking: Reported on 02/02/2022 11/29/21   Enedina Finner, MD  escitalopram (LEXAPRO) 20 MG tablet Take 20 mg by mouth daily.    [provider]  ezetimibe (ZETIA) 10 MG tablet Take 1 tablet (10 mg total) by mouth daily. 02/02/22   Antonieta Iba, MD  hydrochlorothiazide (HYDRODIURIL) 25 MG tablet Take 25 mg by mouth daily.    [provider]  hydrocortisone 2.5 % cream  Apply 1 Application topically 2 (two) times daily as needed (itchy skin).    [provider]  ketoconazole (NIZORAL) 2 % cream Apply 1 Application topically 2 (two) times daily as needed for irritation.    [provider]  lidocaine (XYLOCAINE) 5 % ointment Apply 1 Application topically daily as needed for mild pain.    [provider]  losartan (COZAAR) 100 MG tablet Take 100 mg by mouth daily.    [provider]  metroNIDAZOLE (METROGEL) 0.75 % gel Apply 1 Application topically 2 (two) times daily as needed (topical infection).    [provider]  Semaglutide, 2 MG/DOSE, 8  MG/3ML SOPN Inject 2 mg into the skin every Sunday.    [provider]  triamcinolone ointment (KENALOG) 0.1 % Apply 1 Application topically 2 (two) times daily as needed (skin irritation).    [provider]  ursodiol (ACTIGALL) 500 MG tablet Take 1,000 mg by mouth 2 (two) times daily.    [provider]    Family History Family History  Problem Relation Age of Onset   Hypertension Mother    Hyperlipidemia Mother    Heart attack Mother 80   Heart disease Mother    Hyperlipidemia Father    Hypertension Father    Stroke Father    Cancer Paternal Grandfather        unsure of what type   Breast cancer Neg Hx     Social History Social History   Tobacco Use   Smoking status: Never   Smokeless tobacco: Never  Vaping Use   Vaping status: Never Used  Substance Use Topics   Alcohol use: Never   Drug use: Never     Allergies   Ace inhibitors, Penicillins, Sulfa antibiotics, and Nsaids   Review of Systems Review of Systems   Physical Exam Triage Vital Signs ED Triage Vitals  Encounter Vitals Group     BP 02/16/23 1830 (!) 161/81     Systolic BP Percentile --      Diastolic BP Percentile --      Pulse Rate 02/16/23 1830 86     Resp 02/16/23 1830 18     Temp 02/16/23 1830 98.8 F (37.1 C)     Temp Source 02/16/23 1830 Oral     SpO2 02/16/23 1830 96 %     Weight --      Height --      Head Circumference --      Peak Flow --      Pain Score 02/16/23 1826 0     Pain Loc --      Pain Education --      Exclude from Growth Chart --    No data found.  Updated Vital Signs BP (!) 161/81 (BP Location: Left Arm) Comment (BP Location): regular cuff, forearm  Pulse 86   Temp 98.8 F (37.1 C) (Oral)   Resp 18   SpO2 96%   Visual Acuity Right Eye Distance:   Left Eye Distance:   Bilateral Distance:    Right Eye Near:   Left Eye Near:    Bilateral Near:     Physical Exam Constitutional:      Appearance: Normal appearance.  HENT:      Head: Normocephalic.     Right Ear: Tympanic membrane, ear canal and external ear normal.     Left Ear: Tympanic membrane, ear canal and external ear normal.     Nose: Congestion present. No rhinorrhea.  Mouth/Throat:     Mouth: Mucous membranes are moist.     Pharynx: Oropharynx is clear. No oropharyngeal exudate or posterior oropharyngeal erythema.  Eyes:     Extraocular Movements: Extraocular movements intact.  Cardiovascular:     Rate and Rhythm: Normal rate and regular rhythm.     Pulses: Normal pulses.     Heart sounds: Normal heart sounds.  Pulmonary:     Effort: Pulmonary effort is normal.     Breath sounds: Normal breath sounds.  Musculoskeletal:     Cervical back: Normal range of motion and neck supple.  Neurological:     Mental Status: She is alert and oriented to person, place, and time. Mental status is at baseline.      UC Treatments / Results  Labs (all labs ordered are listed, but only abnormal results are displayed) Labs Reviewed - No data to display  EKG   Radiology No results found.  Procedures Procedures (including critical care time)  Medications Ordered in UC Medications - No data to display  Initial Impression / Assessment and Plan / UC Course  I have reviewed the triage vital signs and the nursing notes.  Pertinent labs & imaging results that were available during my care of the patient were reviewed by me and considered in my medical decision making (see chart for details).  Acute URI  Patient is in no signs of distress nor toxic appearing.  Vital signs are stable.  Low suspicion for pneumonia, pneumothorax or bronchitis and therefore will defer imaging.  Prescribed cefdinir, Tessalon and Promethazine DM as symptoms have been present for 8 days without signs of resolution.May use additional over-the-counter medications as needed for supportive care.  May follow-up with urgent care as needed if symptoms persist or worsen.   Final Clinical  Impressions(s) / UC Diagnoses   Final diagnoses:  Acute URI     Discharge Instructions      And cefdinir every morning and every evening for 7 days to provide coverage for bacteria  You may use Tessalon pill every 8 hours as needed to help your cough, may use cough syrup at bedtime to allow for rest    You can take Tylenol and/or Ibuprofen as needed for fever reduction and pain relief.   For cough: honey 1/2 to 1 teaspoon (you can dilute the honey in water or another fluid).  You can also use guaifenesin and dextromethorphan for cough. You can use a humidifier for chest congestion and cough.  If you don't have a humidifier, you can sit in the bathroom with the hot shower running.      For sore throat: try warm salt water gargles, cepacol lozenges, throat spray, warm tea or water with lemon/honey, popsicles or ice, or OTC cold relief medicine for throat discomfort.   For congestion: take a daily anti-histamine like Zyrtec, Claritin, and a oral decongestant, such as pseudoephedrine.  You can also use Flonase 1-2 sprays in each nostril daily.   It is important to stay hydrated: drink plenty of fluids (water, gatorade/powerade/pedialyte, juices, or teas) to keep your throat moisturized and help further relieve irritation/discomfort.    ED Prescriptions     Medication Sig Dispense Auth. Provider   cefdinir (OMNICEF) 300 MG capsule Take 1 capsule (300 mg total) by mouth 2 (two) times daily for 7 days. 14 capsule Damier Disano R, NP   benzonatate (TESSALON) 100 MG capsule Take 1 capsule (100 mg total) by mouth every 8 (eight) hours. 21 capsule Tomasz Steeves,  Elita Boone, NP   promethazine-dextromethorphan (PROMETHAZINE-DM) 6.25-15 MG/5ML syrup Take 5 mLs by mouth at bedtime as needed for cough. 118 mL Mahamed Zalewski, Elita Boone, NP      PDMP not reviewed this encounter.   Valinda Hoar, NP 02/16/23 [phone]

## 2023-02-16 NOTE — Discharge Instructions (Signed)
And cefdinir every morning and every evening for 7 days to provide coverage for bacteria  You may use Tessalon pill every 8 hours as needed to help your cough, may use cough syrup at bedtime to allow for rest    You can take Tylenol and/or Ibuprofen as needed for fever reduction and pain relief.   For cough: honey 1/2 to 1 teaspoon (you can dilute the honey in water or another fluid).  You can also use guaifenesin and dextromethorphan for cough. You can use a humidifier for chest congestion and cough.  If you don't have a humidifier, you can sit in the bathroom with the hot shower running.      For sore throat: try warm salt water gargles, cepacol lozenges, throat spray, warm tea or water with lemon/honey, popsicles or ice, or OTC cold relief medicine for throat discomfort.   For congestion: take a daily anti-histamine like Zyrtec, Claritin, and a oral decongestant, such as pseudoephedrine.  You can also use Flonase 1-2 sprays in each nostril daily.   It is important to stay hydrated: drink plenty of fluids (water, gatorade/powerade/pedialyte, juices, or teas) to keep your throat moisturized and help further relieve irritation/discomfort.

## 2023-04-09 ENCOUNTER — Other Ambulatory Visit: Payer: Self-pay | Admitting: Cardiovascular Disease

## 2023-04-10 ENCOUNTER — Telehealth: Payer: Self-pay | Admitting: Cardiovascular Disease

## 2023-04-10 NOTE — Telephone Encounter (Signed)
 Last office visit: 02/02/22/23 with plan to f/u prn 12 months Next office visit: none/no active recall

## 2023-04-10 NOTE — Telephone Encounter (Signed)
 Left voicemail, pt needs overdue follow up appt for med refill or can get meds refills through pcp

## 2023-04-10 NOTE — Telephone Encounter (Signed)
 Left voice mail

## 2023-04-10 NOTE — Telephone Encounter (Signed)
 Good Morning,  Could you please schedule this patient a yearly follow up visit? The patient was last seen by Dr. Mariah Milling on 02-02-2022. The patient also has the option to request refills through her PCP if she declines to make an appointment. Thank you so much.

## 2023-04-17 NOTE — Telephone Encounter (Signed)
 Left voice mail

## 2023-04-19 ENCOUNTER — Encounter: Payer: Self-pay | Admitting: Cardiovascular Disease

## 2023-04-19 NOTE — Telephone Encounter (Signed)
 Called 3x, Unable to reach letter sent via mail

## 2023-05-04 ENCOUNTER — Emergency Department

## 2023-05-04 ENCOUNTER — Other Ambulatory Visit: Payer: Self-pay

## 2023-05-04 ENCOUNTER — Emergency Department
Admission: EM | Admit: 2023-05-04 | Discharge: 2023-05-04 | Disposition: A | Discharge status: Home / Self Care | Attending: Emergency Medicine | Admitting: Emergency Medicine

## 2023-05-04 DIAGNOSIS — K8020 Calculus of gallbladder without cholecystitis without obstruction: Secondary | ICD-10-CM | POA: Insufficient documentation

## 2023-05-04 DIAGNOSIS — R109 Unspecified abdominal pain: Secondary | ICD-10-CM | POA: Diagnosis present

## 2023-05-04 LAB — CBC WITH DIFFERENTIAL/PLATELET
Abs Immature Granulocytes: 0.01 10*3/uL (ref 0.00–0.07)
Basophils Absolute: 0.1 10*3/uL (ref 0.0–0.1)
Basophils Relative: 1 %
Eosinophils Absolute: 0.2 10*3/uL (ref 0.0–0.5)
Eosinophils Relative: 4 %
HCT: 41.7 % (ref 36.0–46.0)
Hemoglobin: 13.7 g/dL (ref 12.0–15.0)
Immature Granulocytes: 0 %
Lymphocytes Relative: 35 %
Lymphs Abs: 2.1 10*3/uL (ref 0.7–4.0)
MCH: 32 pg (ref 26.0–34.0)
MCHC: 32.9 g/dL (ref 30.0–36.0)
MCV: 97.4 fL (ref 80.0–100.0)
Monocytes Absolute: 0.4 10*3/uL (ref 0.1–1.0)
Monocytes Relative: 6 %
Neutro Abs: 3.4 10*3/uL (ref 1.7–7.7)
Neutrophils Relative %: 54 %
Platelets: 247 10*3/uL (ref 150–400)
RBC: 4.28 MIL/uL (ref 3.87–5.11)
RDW: 13.4 % (ref 11.5–15.5)
WBC: 6.2 10*3/uL (ref 4.0–10.5)
nRBC: 0 % (ref 0.0–0.2)

## 2023-05-04 LAB — URINALYSIS, ROUTINE W REFLEX MICROSCOPIC
Bacteria, UA: NONE SEEN
Bilirubin Urine: NEGATIVE
Glucose, UA: NEGATIVE mg/dL
Ketones, ur: NEGATIVE mg/dL
Nitrite: NEGATIVE
Protein, ur: NEGATIVE mg/dL
Specific Gravity, Urine: 1.02 (ref 1.005–1.030)
pH: 5 (ref 5.0–8.0)

## 2023-05-04 LAB — HEPATIC FUNCTION PANEL
ALT: 22 U/L (ref 0–44)
AST: 19 U/L (ref 15–41)
Albumin: 3.7 g/dL (ref 3.5–5.0)
Alkaline Phosphatase: 83 U/L (ref 38–126)
Bilirubin, Direct: <0.1 mg/dL (ref 0.0–0.2)
Total Bilirubin: 0.7 mg/dL (ref 0.0–1.2)
Total Protein: 7.3 g/dL (ref 6.5–8.1)

## 2023-05-04 LAB — BASIC METABOLIC PANEL
Anion gap: 9 (ref 5–15)
BUN: 26 mg/dL — ABNORMAL HIGH (ref 8–23)
CO2: 21 mmol/L — ABNORMAL LOW (ref 22–32)
Calcium: 8.9 mg/dL (ref 8.9–10.3)
Chloride: 108 mmol/L (ref 98–111)
Creatinine, Ser: 1.4 mg/dL — ABNORMAL HIGH (ref 0.44–1.00)
GFR, Estimated: 43 mL/min — ABNORMAL LOW (ref >60)
Glucose, Bld: 177 mg/dL — ABNORMAL HIGH (ref 70–99)
Potassium: 4.2 mmol/L (ref 3.5–5.1)
Sodium: 138 mmol/L (ref 135–145)

## 2023-05-04 LAB — LIPASE, BLOOD: Lipase: 35 U/L (ref 11–51)

## 2023-05-04 MED ORDER — KETOROLAC TROMETHAMINE 60 MG/2ML IM SOLN
30.0000 mg | Freq: Once | INTRAMUSCULAR | Status: AC
  Administered 2023-05-04: 30 mg via INTRAMUSCULAR
  Filled 2023-05-04: qty 2

## 2023-05-04 MED ORDER — OXYCODONE-ACETAMINOPHEN 5-325 MG PO TABS
1.0000 | ORAL_TABLET | Freq: Once | ORAL | Status: AC
  Administered 2023-05-04: 1 via ORAL
  Filled 2023-05-04: qty 1

## 2023-05-04 NOTE — ED Triage Notes (Signed)
 Pt reports right side flank pain xfew days, pt denies difficulty urinating or dysuria. Pt has hx kidney stones.

## 2023-05-04 NOTE — Discharge Instructions (Signed)
 I have sent a referral for you to follow-up with general surgery for monitoring outpatient.  Please return for any severe worsening symptoms.  You have gallstones which are likely the source of your symptoms today but no sign of infection.

## 2023-05-04 NOTE — ED Provider Notes (Signed)
 [facility] Provider Note    Event Date/Time   First MD Initiated Contact with Patient 05/04/23 [phone]     (approximate)   History   Flank Pain   HPI Olivia Schroeder is a 63 y.o. female presenting today for right-sided abdominal pain.  Patient states she has had intermittent right-sided flank pain over the past several days which worsened today.  Feels a sharp pain on the right side.  Otherwise denies fever, nausea, vomiting, chest pain, shortness of breath, diarrhea, constipation, dysuria, hematuria.  Prior history of gastric bypass but no other intra-abdominal surgeries.  Also has history of kidney stones as well.     Physical Exam   Triage Vital Signs: ED Triage Vitals  Encounter Vitals Group     BP 05/04/23 0128 (!) 139/117     Systolic BP Percentile --      Diastolic BP Percentile --      Pulse Rate 05/04/23 0128 81     Resp 05/04/23 0128 20     Temp 05/04/23 0128 98.7 F (37.1 C)     Temp Source 05/04/23 0128 Oral     SpO2 05/04/23 0128 95 %     Weight 05/04/23 0128 287 lb (130.2 kg)     Height 05/04/23 0128 5\' 4"  (1.626 m)     Head Circumference --      Peak Flow --      Pain Score 05/04/23 0127 10     Pain Loc --      Pain Education --      Exclude from Growth Chart --     Most recent vital signs: Vitals:   05/04/23 0128  BP: (!) 139/117  Pulse: 81  Resp: 20  Temp: 98.7 F (37.1 C)  SpO2: 95%   Physical Exam: I have reviewed the vital signs and nursing notes. General: Awake, alert, no acute distress.  Nontoxic appearing. Head:  Atraumatic, normocephalic.   ENT:  EOM intact, PERRL. Oral mucosa is pink and moist with no lesions. Neck: Neck is supple with full range of motion, No meningeal signs. Cardiovascular:  RRR, No murmurs. Peripheral pulses palpable and equal bilaterally. Respiratory:  Symmetrical chest wall expansion.  No rhonchi, rales, or wheezes.  Good air movement throughout.  No use of accessory muscles.    Musculoskeletal:  No cyanosis or edema. Moving extremities with full ROM Abdomen:  Soft, nontender, nondistended. Neuro:  GCS 15, moving all four extremities, interacting appropriately. Speech clear. Psych:  Calm, appropriate.   Skin:  Warm, dry, no rash.    ED Results / Procedures / Treatments   Labs (all labs ordered are listed, but only abnormal results are displayed) Labs Reviewed  BASIC METABOLIC PANEL - Abnormal; Notable for the following components:      Result Value   CO2 21 (*)    Glucose, Bld 177 (*)    BUN 26 (*)    Creatinine, Ser 1.40 (*)    GFR, Estimated 43 (*)    All other components within normal limits  URINALYSIS, ROUTINE W REFLEX MICROSCOPIC - Abnormal; Notable for the following components:   Color, Urine YELLOW (*)    APPearance HAZY (*)    Hgb urine dipstick SMALL (*)    Leukocytes,Ua MODERATE (*)    All other components within normal limits  CBC WITH DIFFERENTIAL/PLATELET  LIPASE, BLOOD  HEPATIC FUNCTION PANEL     EKG    RADIOLOGY Independently interpreted CT abdomen/pelvis with no acute pathology  PROCEDURES:  Critical Care performed: No  Procedures   MEDICATIONS ORDERED IN ED: Medications  ketorolac (TORADOL) injection 30 mg (30 mg Intramuscular Given 05/04/23 0447)  oxyCODONE-acetaminophen (PERCOCET/ROXICET) 5-325 MG per tablet 1 tablet (1 tablet Oral Given 05/04/23 0448)     IMPRESSION / MDM / ASSESSMENT AND PLAN / ED COURSE  I reviewed the triage vital signs and the nursing notes.                              Differential diagnosis includes, but is not limited to, nephrolithiasis, acute cystitis, pyelonephritis, cholecystitis, cholelithiasis,  Patient's presentation is most consistent with acute complicated illness / injury requiring diagnostic workup.  Patient is a 63 year old female presenting today for right-sided flank pain.  On exam there is no specific tenderness in the right upper quadrant or right sided CVA  tenderness.  Otherwise asymptomatic at this time with vital signs stable.  Laboratory workup shows normal CBC.  BMP with creatinine at 1.4 but not due to similar from her baseline.  CT abdomen/pelvis shows nonobstructing bilateral nephrolithiasis which would not cause her pain symptoms.  She does have cholelithiasis present but no signs of cholecystitis.  Biliary colic could potentially be the source of all of her symptoms today.  Patient given pain medicine.  Hepatic function panel and lipase otherwise reassuring.  UA with no sign of infection.  Patient feeling better at this time.  Suspect symptoms today are all related to biliary colic with cholelithiasis.  Otherwise safe for discharge and will give follow-up with general surgery as needed.  Given strict return precautions.     FINAL CLINICAL IMPRESSION(S) / ED DIAGNOSES   Final diagnoses:  Calculus of gallbladder without cholecystitis without obstruction     Rx / DC Orders   ED Discharge Orders          Ordered    Ambulatory referral to General Surgery       Comments: Symptomatic cholelithiasis   05/04/23 0620             Note:  This document was prepared using Dragon voice recognition software and may include unintentional dictation errors.   Janith Lima, MD 05/04/23 [phone]

## 2023-05-06 ENCOUNTER — Other Ambulatory Visit: Payer: Self-pay | Admitting: Cardiovascular Disease

## 2023-05-09 ENCOUNTER — Ambulatory Visit: Payer: Self-pay | Admitting: General Surgery

## 2023-05-09 NOTE — H&P (Signed)
 PATIENT PROFILE: Olivia Schroeder is a 63 y.o. female who presents to the Clinic for consultation at the request of Dr. Eli Phillips for evaluation of cholelithiasis.  PCP:  Gardiner Coins, PA  History of Present Illness Olivia Schroeder is a 63 year old female with cholelithiasis who presents with gallbladder pain. She is accompanied by her daughter-in-law.  She presents for evaluation of cholelithiasis found on a CT scan of the abdomen and pelvis performed on May 04, 2023. The scan showed cholelithiasis without signs of cholecystitis and non-obstructing bilateral nephrolithiasis measuring up to 9 mm.  She experiences severe pain, described as 'ten plus' on a pain scale, which is constant and exacerbated by movement and coughing. The pain is located in the right flank area.  She was initially seen in the emergency room on May 04, 2023, due to right flank pain, where she was administered oxycodone and tramadol, which did not alleviate her pain. She was advised to take Tylenol at home, which also did not provide relief. The pain is so severe that she cannot move or cough without significant discomfort.  She has a history of portal hypertension and cirrhosis, for which she sees a liver specialist. She has previously experienced kidney stones, which required emergency surgery due to sepsis over ten years ago.  She is cautious with her diet, avoiding foods that may aggravate her gallbladder, and has been consuming dry toast and soup recently.  No current fever or signs of infection.   PROBLEM LIST: Problem List  Date Reviewed: 04/20/2023          Noted   Stage 3b chronic kidney disease (CMS/HHS-HCC) 04/20/2023   Hyperparathyroidism , secondary, non-renal (CMS/HHS-HCC) 09/14/2022   Type 2 diabetes mellitus with hyperlipidemia  (CMS/HHS-HCC) 01/05/2022   Overview    Last Assessment & Plan: Formatting of this note might be different from the original. Recent A1c 6.3, well controlled.  Patient  states that she is not taking Ozempic at home -Sliding scale insulin      Hypertension 01/05/2022   Overview    ECHO 11/2011-EF 60%, Trace MR and TR EKG 11/2010-1st degree heart block Last Assessment & Plan: Formatting of this note might be different from the original. -hold home blood pressure medications due to sepsis and risk of developing hypotension -IV hydralazine as needed      HLD (hyperlipidemia) 01/05/2022   Overview    Last Assessment & Plan: Formatting of this note might be different from the original. 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        OSA (obstructive sleep apnea)  Unknown   Abnormal liver function test 10/28/2011   Anemia 10/28/2011   Vitamin D deficiency Unknown    GENERAL REVIEW OF SYSTEMS:   General ROS: negative for - chills, fatigue, fever, weight gain or weight loss Allergy and Immunology ROS: negative for - hives  Hematological and Lymphatic ROS: negative for - bleeding problems or bruising, negative for palpable nodes Endocrine ROS: negative for -  heat or cold intolerance, hair changes Respiratory ROS: negative for - cough, shortness of breath or wheezing Cardiovascular ROS: no chest pain or palpitations GI ROS: negative for nausea, vomiting, positive for right flank/abdominal and diarrhea Musculoskeletal ROS: negative for - joint swelling or muscle pain Neurological ROS: negative for - confusion, syncope Dermatological ROS: negative for pruritus and rash Psychiatric: negative for anxiety, depression, difficulty sleeping and memory loss  MEDICATIONS: Current Outpatient Medications  Medication Sig Dispense Refill   amLODIPine (NORVASC) 5 MG tablet Take 1 tablet (5 mg total) by mouth once daily 90 tablet 3   atorvastatin (LIPITOR) 40 MG tablet TAKE 1 TABLET BY MOUTH EVERY DAY IN THE EVENING 90 tablet 2   blood glucose diagnostic (ONETOUCH ULTRA TEST) test strip once daily AS INSTRUCTED 100 strip 3   buPROPion (WELLBUTRIN XL) 150 MG XL tablet Take 1 tablet (150 mg total) by mouth once daily 90 tablet 3   carvediloL (COREG) 25 MG tablet Take 1 tablet (25 mg total) by mouth 2 (two) times daily with meals 180 tablet 3   desoximetasone (TOPICORT) 0.25 % ointment Apply topically 2 (two) times daily Apply to arm and neck 60 g 3   escitalopram oxalate (LEXAPRO) 20 MG tablet Take 1 tablet (20 mg total) by mouth once daily 90 tablet 2   ezetimibe (ZETIA) 10 mg tablet Take 10 mg by mouth     hydroCHLOROthiazide (HYDRODIURIL) 25 MG tablet Take 1 tablet (25 mg total) by mouth once daily 90 tablet 3   infliximab-dyyb (INFLECTRA IV) Inject into the vein every 28 (twenty-eight) days     lancets (ONETOUCH DELICA PLUS LANCET) USE 1 EACH ONCE DAILY USE AS INSTRUCTED. 100 each 3   lidocaine 5 % ointment Apply topically as directed To affected areas 50 g 3   losartan (COZAAR) 100 MG tablet Take 1 tablet (100 mg total) by mouth once daily 90 tablet 3   MOUNJARO 5 mg/0.5 mL pen injector 5 mg once a week     ursodioL (URSO FORTE) 500 MG tablet TAKE 2 TABLETS  BY MOUTH TWICE A DAY 360 tablet 3   blood glucose meter kit as directed 1 each 0   cholecalciferol, vitamin D3, (REPLESTA) 1,250 mcg (50,000 unit) Wafr Take 1 Wafer by mouth every 7 (seven) days (Patient not taking: Reported on 05/09/2023) 4 Wafer 11   No current facility-administered medications for this visit.    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     Dysthymic disorder    Encounter for blood transfusion 2012   2013   Essential  hypertension, benign    GERD (gastroesophageal reflux disease)    Hidradenitis 08/07/2015   Hidradenitis suppurativa 12/09/2016   History of COVID-19 11/28/2021   + test at [facility] w/ admission   Immunosuppressed status (CMS/HHS-HCC) 02/15/2018   On Remicaid    Mixed hyperlipidemia    Morbid obesity (CMS/HHS-HCC)    OSA (obstructive sleep apnea)    Poor intravenous access    Portal hypertension (CMS/HHS-HCC) 05/20/2021   Primary biliary cholangitis (CMS/HHS-HCC) 03/06/2018   Primary biliary cholangitis (CMS/HHS-HCC)    Sleep apnea    weight loss surgery has not used CPAP   Stone, kidney 06/2011   s/p lithotripsy 07/2011, stent, urosepsis 06/2011   Transfusion of blood during current hospitalization    Type 2 diabetes mellitus (CMS/HHS-HCC)    Type II or unspecified type diabetes mellitus without mention of complication, not stated as uncontrolled (CMS/HHS-HCC)    Unspecified vitamin D  deficiency     PAST SURGICAL HISTORY: Past Surgical History:  Procedure Laterality Date   PANNICULECTOMY  05/2011   CYSTOSCOPY W/INSERTION/EXCHANGE URETERAL STENT  07/2011   BARIATRIC SURGERY  01/2016   LAPAROSCOPIC GASTRECTOMY  01/26/2016   sleeve gastrectomy, [facility]   EGD N/A 08/03/2018   Procedure: ESOPHAGOGASTRODUODENOSCOPY, FLEXIBLE, TRANSORAL; DIAGNOSTIC, INCLUDING COLLECTION OF SPECIMEN(S) BY BRUSHING OR WASHING, WHEN PERFORMED (SEPARATE PROCEDURE);  Surgeon: Azucena Cecil, MD;  Location: [facility] ENDO/BRONCH;  Service: Gastroenterology;  Laterality: N/A;   COLONOSCOPY N/A 08/03/2018   Procedure: COLORECTAL CANCER SCREENING; COLONOSCOPY ON INDIVIDUAL NOT MEETING CRITERIA FOR HIGH RISK;  Surgeon: Azucena Cecil, MD;  Location: [facility] ENDO/BRONCH;  Service: Gastroenterology;  Laterality: N/A;   INSERTION INTRAUTERINE DEVICE N/A 03/22/2022   Procedure: INSERTION OF INTRAUTERINE DEVICE (IUD);  Surgeon: Pleasant, Conception Chancy., MD;  Location: [facility] OR;  Service: Gynecology;  Laterality: N/A;   BREAST BIOPSY     CESAREAN SECTION     EGD     Hidranitis Resection  N/A    09/2010 Abdominal Wall [facility], [facility] RN. 2 admissions for cellulitis of abdominal wall, for procedure 09/2011-gluteal   TONSILLECTOMY       FAMILY HISTORY: Family History  Problem Relation Name Age of Onset   Heart disease Mother Baltazar Apo    Diabetes type II Mother Baltazar Apo    Diabetes Mother Baltazar Apo    Stroke Father Thurmond Butts    High blood pressure (Hypertension) Other Family Hx    Heart disease Other Family Hx    Depression Other Family Hx    Cancer Other Family Hx    High blood pressure (Hypertension) Sister Misty Stanley    Depression Sister Misty Stanley    Clotting disorder Sister Misty Stanley    Other Sister Fleet Contras        ulcerative colitis   Depression Sister Fleet Contras    High blood pressure (Hypertension) Sister Fleet Contras    Diabetes Sister Fleet Contras    Other Sister       SOCIAL  HISTORY: Social History   Socioeconomic History   Marital status: Widowed  Tobacco Use   Smoking status: Never    Passive exposure: Never   Smokeless tobacco: Never  Vaping Use   Vaping status: Never Used  Substance and Sexual Activity   Alcohol use: No    Alcohol/week: 0.0 standard drinks of alcohol   Drug use: No   Sexual activity: Not Currently    Partners: Male    Birth control/protection: Post-menopausal  Other Topics Concern   Would you please tell us  about the people who live in your home, your pets, or anything else important to your social life? Yes    Comment: Pug, Joelene Millin  Social History Narrative   Lives with Dog x 1. Widowed.   Job: disabled, has done [employer].  Exercise:   Wears seatbelt, sunscreen.  Dentist: will work on.  Calcium in diet: yes.  Religious beliefs affecting health care: none.      Advanced Directives: will work on, is working on Orthoptist.Paper work given on 12/27/2017.       05/202024   Military Service: None   Likes/Enjoys/What fills your day?:  Narrative   Home: One Story   Your Bedrooms is on: First Level   Fewest Steps to enter the home: 0 with ramp   Other persons in the home: son, daughter-in-law, grandson and granddaughter   Pets: Child psychotherapist you use daily: hospital bed   Medical equipment available in the home: BSC/3:1, Environmental consultant, Media planner, and C Pap / Bipap   Dental: significant dental problems. Last screening Date: May 2016 Screening is: In need   Vision: Denies any issues with Vision","Wears Reading glasses","Prescription Glasses","Contact Lenses"}.. Last screening Date: April 2023 Screening is: Up to date. [facility]   Hearing: Denies any issues in Hearing .    Dermatology: Denies any areas of concern on skin. Last screening Date: February 2024 Screening is: Up to date. [facility]       Social Drivers of Health   Financial Resource Strain: High Risk (05/09/2023)   Overall Financial Resource  Strain (CARDIA)    Difficulty of Paying Living Expenses: Very hard  Food Insecurity: Food Insecurity Present (05/09/2023)   Hunger Vital Sign    Worried About Running Out of Food in the Last Year: Often true    Ran Out of Food in the Last Year: Sometimes true  Transportation Needs: No Transportation Needs (05/09/2023)   PRAPARE - Administrator, Civil Service (Medical): No    Lack of Transportation (Non-Medical): No    PHYSICAL EXAM: Vitals:   05/09/23 1043  BP: 123/77  Pulse: 94   Body mass index is 46.86 kg/m. Weight: (!) 123.8 kg (273 lb)   GENERAL: Alert, active, oriented x3  HEENT: Pupils equal reactive to light. Extraocular movements are intact. Sclera clear. Palpebral conjunctiva normal red color.Pharynx clear.  NECK: Supple with no palpable mass and no adenopathy.  LUNGS: Sound clear with no rales rhonchi or wheezes.  HEART: Regular rhythm S1 and S2 without murmur.  ABDOMEN: Soft and depressible, nontender with no palpable mass, no hepatomegaly.   EXTREMITIES: Well-developed well-nourished symmetrical with no dependent edema.  NEUROLOGICAL: Awake alert oriented, facial expression symmetrical, moving all extremities.  REVIEW OF DATA: I have reviewed the following data today: Admission on 05/05/2023, Discharged on 05/05/2023  Component Date Value   Lipase 05/05/2023 26    Sodium 05/05/2023 141    Potassium 05/05/2023 4.2    Chloride 05/05/2023 107    Carbon Dioxide (CO2) 05/05/2023 26    Urea Nitrogen (BUN) 05/05/2023 27 (H)    Creatinine 05/05/2023 1.2 (H)    Glucose 05/05/2023 90    Calcium 05/05/2023 9.3    AST (Aspartate Aminotran* 05/05/2023 18    ALT (Alanine Aminotransf* 05/05/2023 19    Bilirubin, Total 05/05/2023 0.8    Alk Phos (Alkaline Phosp* 05/05/2023 91    Albumin 05/05/2023 3.7    Protein, Total 05/05/2023 7.2    Anion Gap 05/05/2023 8  BUN/CREA Ratio 05/05/2023 22    Glomerular Filtration Ra* 05/05/2023 51    WBC (White  Blood Cell Co* 05/05/2023 7.0    Hemoglobin 05/05/2023 13.2    Hematocrit 05/05/2023 41.5    Platelets 05/05/2023 242    MCV (Mean Corpuscular Vo* 05/05/2023 98    MCH (Mean Corpuscular He* 05/05/2023 31.3    MCHC (Mean Corpuscular H* 05/05/2023 31.8    RBC (Red Blood Cell Coun* 05/05/2023 4.22    RDW-CV (Red Cell Distrib* 05/05/2023 13.3    NRBC (Nucleated Red Bloo* 05/05/2023 0.00    NRBC % (Nucleated Red Bl* 05/05/2023 0.0    MPV (Mean Platelet Volum* 05/05/2023 10.7    Neutrophil Count 05/05/2023 4.6    Neutrophil % 05/05/2023 64.8    Lymphocyte Count 05/05/2023 1.8    Lymphocyte % 05/05/2023 24.9    Monocyte Count 05/05/2023 0.4    Monocyte % 05/05/2023 6.0    Eosinophil Count 05/05/2023 0.24    Eosinophil % 05/05/2023 3.4    Basophil Count 05/05/2023 0.04    Basophil % 05/05/2023 0.6    Immature Granulocyte Cou* 05/05/2023 0.02    Immature Granulocyte % 05/05/2023 0.3   Same Day Visit on 04/20/2023  Component Date Value   Hemoglobin A1C 04/20/2023 5.8 (H)    Average Blood Glucose (C* 04/20/2023 118    WBC (White Blood Cell Co* 04/20/2023 5.6    Hemoglobin 04/20/2023 13.9    Hematocrit 04/20/2023 45.5 (H)    Platelets 04/20/2023 266    MCV (Mean Corpuscular Vo* 04/20/2023 99 (H)    MCH (Mean Corpuscular He* 04/20/2023 30.3    MCHC (Mean Corpuscular H* 04/20/2023 30.5 (L)    RBC (Red Blood Cell Coun* 04/20/2023 4.58    RDW-CV (Red Cell Distrib* 04/20/2023 13.6    NRBC (Nucleated Red Bloo* 04/20/2023 0.00    NRBC % (Nucleated Red Bl* 04/20/2023 0.0    MPV (Mean Platelet Volum* 04/20/2023 11.0    Neutrophil Count 04/20/2023 3.2    Neutrophil % 04/20/2023 56.7    Lymphocyte Count 04/20/2023 1.7    Lymphocyte % 04/20/2023 30.1    Monocyte Count 04/20/2023 0.5    Monocyte % 04/20/2023 8.3    Eosinophil Count 04/20/2023 0.22    Eosinophil % 04/20/2023 4.0    Basophil Count 04/20/2023 0.04    Basophil % 04/20/2023 0.7    Immature Granulocyte Cou* 04/20/2023 0.01     Immature Granulocyte % 04/20/2023 0.2    Thyroid Stimulating Horm* 04/20/2023 0.93    Sodium 04/20/2023 145    Potassium 04/20/2023 4.7    Chloride 04/20/2023 107    Carbon Dioxide (CO2) 04/20/2023 23    Urea Nitrogen (BUN) 04/20/2023 22 (H)    Creatinine 04/20/2023 1.5 (H)    Glucose 04/20/2023 110    Calcium 04/20/2023 9.6    AST (Aspartate Aminotran* 04/20/2023 20    ALT (Alanine Aminotransf* 04/20/2023 23    Bilirubin, Total 04/20/2023 0.7    Alk Phos (Alkaline Phosp* 04/20/2023 89    Albumin 04/20/2023 4.0    Protein, Total 04/20/2023 7.5    Anion Gap 04/20/2023 15 (H)    BUN/CREA Ratio 04/20/2023 15    Glomerular Filtration Ra* 04/20/2023 39    CRP (C-reactive Protein,* 04/20/2023 0.19    Sedimentation Rate-Autom* 04/20/2023 20    NIL 04/20/2023 0.02    TB 1 Antigen 04/20/2023 0.02    TB 2 Antigen 04/20/2023 0.02    Mitogen 04/20/2023 10.00    Quantiferon TB 04/20/2023 Negative  NIL Rflx 04/20/2023 0.02    TB 1 Antigen Rflx 04/20/2023 0.02    TB 2 Antigen Rflx 04/20/2023 0.02    Mitogen Rflx 04/20/2023 10.00    QFT Rflx Instrument Trig* 04/20/2023 1.00   Hospital Outpatient Visit on 03/11/2023  Component Date Value   Creatinine 03/11/2023 1.5 (H)    Glomerular Filtration Ra* 03/11/2023 39    CRP (C-reactive Protein,* 03/11/2023 0.33    Protein, Total 03/11/2023 7.0    Albumin 03/11/2023 3.8    Bilirubin, Total 03/11/2023 1.4    Bilirubin, Conjugated 03/11/2023     Alk Phos (Alkaline Phosp* 03/11/2023 91    AST (Aspartate Aminotran* 03/11/2023 37    ALT (Alanine Aminotransf* 03/11/2023 26    Sedimentation Rate-Autom* 03/11/2023 20    WBC (White Blood Cell Co* 03/11/2023 5.3    Hemoglobin 03/11/2023 13.3    Hematocrit 03/11/2023 42.1    Platelets 03/11/2023 240    MCV (Mean Corpuscular Vo* 03/11/2023 95    MCH (Mean Corpuscular He* 03/11/2023 30.0    MCHC (Mean Corpuscular H* 03/11/2023 31.6    RBC (Red Blood Cell Coun* 03/11/2023 4.43    RDW-CV (Red Cell  Distrib* 03/11/2023 13.8    NRBC (Nucleated Red Bloo* 03/11/2023 0.00    NRBC % (Nucleated Red Bl* 03/11/2023 0.0    MPV (Mean Platelet Volum* 03/11/2023 10.9    Neutrophil Count 03/11/2023 3.1    Neutrophil % 03/11/2023 57.3    Lymphocyte Count 03/11/2023 1.6    Lymphocyte % 03/11/2023 29.0    Monocyte Count 03/11/2023 0.5    Monocyte % 03/11/2023 10.1    Eosinophil Count 03/11/2023 0.15    Eosinophil % 03/11/2023 2.8    Basophil Count 03/11/2023 0.03    Basophil % 03/11/2023 0.6    Immature Granulocyte Cou* 03/11/2023 0.01    Immature Granulocyte % 03/11/2023 0.2      @RESULSEC @  Assessment & Plan Cholelithiasis   Cholelithiasis was identified on a CT scan of the abdomen and pelvis on May 04, 2023, showing gallstones without cholecystitis. She reports severe right flank pain, rated 10/10, which is atypical for gallbladder pain as it is constant and aggravated by movement rather than eating. There is uncertainty about the gallbladder being the pain source due to the lack of inflammation or obstruction on the CT scan. The only definitive treatment is cholecystectomy, but there is no guarantee of pain resolution if unrelated to the gallbladder. She is aware of surgical risks, including anesthesia and potential post-operative pain, and prefers to proceed with surgery. Schedule cholecystectomy as soon as possible, preferably within the week. Provide post-operative instructions and schedule follow-up appointments as needed after surgery.  Bilateral Nephrolithiasis   Non-obstructing bilateral nephrolithiasis was noted on the CT scan, with stones up to 9 mm. Kidney stones were considered as a pain source, but no obstruction or inflammation was noted on the CT scan to typically cause such severe pain. She has a history of kidney stones requiring emergency surgery due to sepsis. Monitor for any changes in symptoms suggesting obstruction or infection. Consult with a nephrologist if pain persists  post-cholecystectomy.  Cirrhosis   Cirrhosis is under the care of a hepatologist. Current liver function tests are normal with no immediate concerns.   Follow-up   She prefers surgery to address the gallbladder issue despite uncertainty about the pain source. Discussed risks and benefits, including the possibility of unresolved pain if unrelated to the gallbladder. Provide post-operative instructions and schedule follow-up appointments as needed after  surgery.  Patient was oriented about the diagnosis of cholelithiasis. Also oriented about what is the gallbladder, its anatomy and function and the implications of having stones. The patient was oriented about the treatment alternatives (observation vs cholecystectomy). Surgical technique (laparoscopic) was discussed. It was also discussed the goals of the surgery (decrease the pain episodes and avoid the risk of cholecystitis) and the risk of surgery including: bleeding, infection, common bile duct injury, stone retention, injury to other organs such as bowel, liver, stomach, other complications such as hernia, bowel obstruction among others. Also discussed with patient about anesthesia and its complications such as: reaction to medications, pneumonia, heart complications, death, among others.   Cholelithiasis without cholecystitis [K80.20]  Patient and and her daughter-in-law verbalized understanding, all questions were answered, and were agreeable with the plan outlined above.     Carolan Shiver, MD  Electronically signed by Carolan Shiver, MD

## 2023-05-09 NOTE — H&P (View-Only) (Signed)
 PATIENT PROFILE: Olivia Schroeder is a 63 y.o. female who presents to the Clinic for consultation at the request of Dr. Eli Phillips for evaluation of cholelithiasis.  PCP:  Gardiner Coins, PA  History of Present Illness Olivia Schroeder is a 63 year old female with cholelithiasis who presents with gallbladder pain. She is accompanied by her daughter-in-law.  She presents for evaluation of cholelithiasis found on a CT scan of the abdomen and pelvis performed on May 04, 2023. The scan showed cholelithiasis without signs of cholecystitis and non-obstructing bilateral nephrolithiasis measuring up to 9 mm.  She experiences severe pain, described as 'ten plus' on a pain scale, which is constant and exacerbated by movement and coughing. The pain is located in the right flank area.  She was initially seen in the emergency room on May 04, 2023, due to right flank pain, where she was administered oxycodone and tramadol, which did not alleviate her pain. She was advised to take Tylenol at home, which also did not provide relief. The pain is so severe that she cannot move or cough without significant discomfort.  She has a history of portal hypertension and cirrhosis, for which she sees a liver specialist. She has previously experienced kidney stones, which required emergency surgery due to sepsis over ten years ago.  She is cautious with her diet, avoiding foods that may aggravate her gallbladder, and has been consuming dry toast and soup recently.  No current fever or signs of infection.   PROBLEM LIST: Problem List  Date Reviewed: 04/20/2023          Noted   Stage 3b chronic kidney disease (CMS/HHS-HCC) 04/20/2023   Hyperparathyroidism , secondary, non-renal (CMS/HHS-HCC) 09/14/2022   Type 2 diabetes mellitus with hyperlipidemia  (CMS/HHS-HCC) 01/05/2022   Overview    Last Assessment & Plan: Formatting of this note might be different from the original. Recent A1c 6.3, well controlled.  Patient  states that she is not taking Ozempic at home -Sliding scale insulin      Hypertension 01/05/2022   Overview    ECHO 11/2011-EF 60%, Trace MR and TR EKG 11/2010-1st degree heart block Last Assessment & Plan: Formatting of this note might be different from the original. -hold home blood pressure medications due to sepsis and risk of developing hypotension -IV hydralazine as needed      HLD (hyperlipidemia) 01/05/2022   Overview    Last Assessment & Plan: Formatting of this note might be different from the original. 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        OSA (obstructive sleep apnea)  Unknown   Abnormal liver function test 10/28/2011   Anemia 10/28/2011   Vitamin D deficiency Unknown    GENERAL REVIEW OF SYSTEMS:   General ROS: negative for - chills, fatigue, fever, weight gain or weight loss Allergy and Immunology ROS: negative for - hives  Hematological and Lymphatic ROS: negative for - bleeding problems or bruising, negative for palpable nodes Endocrine ROS: negative for -  heat or cold intolerance, hair changes Respiratory ROS: negative for - cough, shortness of breath or wheezing Cardiovascular ROS: no chest pain or palpitations GI ROS: negative for nausea, vomiting, positive for right flank/abdominal and diarrhea Musculoskeletal ROS: negative for - joint swelling or muscle pain Neurological ROS: negative for - confusion, syncope Dermatological ROS: negative for pruritus and rash Psychiatric: negative for anxiety, depression, difficulty sleeping and memory loss  MEDICATIONS: Current Outpatient Medications  Medication Sig Dispense Refill   amLODIPine (NORVASC) 5 MG tablet Take 1 tablet (5 mg total) by mouth once daily 90 tablet 3   atorvastatin (LIPITOR) 40 MG tablet TAKE 1 TABLET BY MOUTH EVERY DAY IN THE EVENING 90 tablet 2   blood glucose diagnostic (ONETOUCH ULTRA TEST) test strip once daily AS INSTRUCTED 100 strip 3   buPROPion (WELLBUTRIN XL) 150 MG XL tablet Take 1 tablet (150 mg total) by mouth once daily 90 tablet 3   carvediloL (COREG) 25 MG tablet Take 1 tablet (25 mg total) by mouth 2 (two) times daily with meals 180 tablet 3   desoximetasone (TOPICORT) 0.25 % ointment Apply topically 2 (two) times daily Apply to arm and neck 60 g 3   escitalopram oxalate (LEXAPRO) 20 MG tablet Take 1 tablet (20 mg total) by mouth once daily 90 tablet 2   ezetimibe (ZETIA) 10 mg tablet Take 10 mg by mouth     hydroCHLOROthiazide (HYDRODIURIL) 25 MG tablet Take 1 tablet (25 mg total) by mouth once daily 90 tablet 3   infliximab-dyyb (INFLECTRA IV) Inject into the vein every 28 (twenty-eight) days     lancets (ONETOUCH DELICA PLUS LANCET) USE 1 EACH ONCE DAILY USE AS INSTRUCTED. 100 each 3   lidocaine 5 % ointment Apply topically as directed To affected areas 50 g 3   losartan (COZAAR) 100 MG tablet Take 1 tablet (100 mg total) by mouth once daily 90 tablet 3   MOUNJARO 5 mg/0.5 mL pen injector 5 mg once a week     ursodioL (URSO FORTE) 500 MG tablet TAKE 2 TABLETS  BY MOUTH TWICE A DAY 360 tablet 3   blood glucose meter kit as directed 1 each 0   cholecalciferol, vitamin D3, (REPLESTA) 1,250 mcg (50,000 unit) Wafr Take 1 Wafer by mouth every 7 (seven) days (Patient not taking: Reported on 05/09/2023) 4 Wafer 11   No current facility-administered medications for this visit.    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     Dysthymic disorder    Encounter for blood transfusion 2012   2013   Essential  hypertension, benign    GERD (gastroesophageal reflux disease)    Hidradenitis 08/07/2015   Hidradenitis suppurativa 12/09/2016   History of COVID-19 11/28/2021   + test at [facility] w/ admission   Immunosuppressed status (CMS/HHS-HCC) 02/15/2018   On Remicaid    Mixed hyperlipidemia    Morbid obesity (CMS/HHS-HCC)    OSA (obstructive sleep apnea)    Poor intravenous access    Portal hypertension (CMS/HHS-HCC) 05/20/2021   Primary biliary cholangitis (CMS/HHS-HCC) 03/06/2018   Primary biliary cholangitis (CMS/HHS-HCC)    Sleep apnea    weight loss surgery has not used CPAP   Stone, kidney 06/2011   s/p lithotripsy 07/2011, stent, urosepsis 06/2011   Transfusion of blood during current hospitalization    Type 2 diabetes mellitus (CMS/HHS-HCC)    Type II or unspecified type diabetes mellitus without mention of complication, not stated as uncontrolled (CMS/HHS-HCC)    Unspecified vitamin D  deficiency     PAST SURGICAL HISTORY: Past Surgical History:  Procedure Laterality Date   PANNICULECTOMY  05/2011   CYSTOSCOPY W/INSERTION/EXCHANGE URETERAL STENT  07/2011   BARIATRIC SURGERY  01/2016   LAPAROSCOPIC GASTRECTOMY  01/26/2016   sleeve gastrectomy, [facility]   EGD N/A 08/03/2018   Procedure: ESOPHAGOGASTRODUODENOSCOPY, FLEXIBLE, TRANSORAL; DIAGNOSTIC, INCLUDING COLLECTION OF SPECIMEN(S) BY BRUSHING OR WASHING, WHEN PERFORMED (SEPARATE PROCEDURE);  Surgeon: Azucena Cecil, MD;  Location: [facility] ENDO/BRONCH;  Service: Gastroenterology;  Laterality: N/A;   COLONOSCOPY N/A 08/03/2018   Procedure: COLORECTAL CANCER SCREENING; COLONOSCOPY ON INDIVIDUAL NOT MEETING CRITERIA FOR HIGH RISK;  Surgeon: Azucena Cecil, MD;  Location: [facility] ENDO/BRONCH;  Service: Gastroenterology;  Laterality: N/A;   INSERTION INTRAUTERINE DEVICE N/A 03/22/2022   Procedure: INSERTION OF INTRAUTERINE DEVICE (IUD);  Surgeon: Pleasant, Conception Chancy., MD;  Location: [facility] OR;  Service: Gynecology;  Laterality: N/A;   BREAST BIOPSY     CESAREAN SECTION     EGD     Hidranitis Resection  N/A    09/2010 Abdominal Wall [facility], [facility] RN. 2 admissions for cellulitis of abdominal wall, for procedure 09/2011-gluteal   TONSILLECTOMY       FAMILY HISTORY: Family History  Problem Relation Name Age of Onset   Heart disease Mother Baltazar Apo    Diabetes type II Mother Baltazar Apo    Diabetes Mother Baltazar Apo    Stroke Father Thurmond Butts    High blood pressure (Hypertension) Other Family Hx    Heart disease Other Family Hx    Depression Other Family Hx    Cancer Other Family Hx    High blood pressure (Hypertension) Sister Misty Stanley    Depression Sister Misty Stanley    Clotting disorder Sister Misty Stanley    Other Sister Fleet Contras        ulcerative colitis   Depression Sister Fleet Contras    High blood pressure (Hypertension) Sister Fleet Contras    Diabetes Sister Fleet Contras    Other Sister       SOCIAL  HISTORY: Social History   Socioeconomic History   Marital status: Widowed  Tobacco Use   Smoking status: Never    Passive exposure: Never   Smokeless tobacco: Never  Vaping Use   Vaping status: Never Used  Substance and Sexual Activity   Alcohol use: No    Alcohol/week: 0.0 standard drinks of alcohol   Drug use: No   Sexual activity: Not Currently    Partners: Male    Birth control/protection: Post-menopausal  Other Topics Concern   Would you please tell us  about the people who live in your home, your pets, or anything else important to your social life? Yes    Comment: Pug, Joelene Millin  Social History Narrative   Lives with Dog x 1. Widowed.   Job: disabled, has done [employer].  Exercise:   Wears seatbelt, sunscreen.  Dentist: will work on.  Calcium in diet: yes.  Religious beliefs affecting health care: none.      Advanced Directives: will work on, is working on Orthoptist.Paper work given on 12/27/2017.       05/202024   Military Service: None   Likes/Enjoys/What fills your day?:  Narrative   Home: One Story   Your Bedrooms is on: First Level   Fewest Steps to enter the home: 0 with ramp   Other persons in the home: son, daughter-in-law, grandson and granddaughter   Pets: Child psychotherapist you use daily: hospital bed   Medical equipment available in the home: BSC/3:1, Environmental consultant, Media planner, and C Pap / Bipap   Dental: significant dental problems. Last screening Date: May 2016 Screening is: In need   Vision: Denies any issues with Vision","Wears Reading glasses","Prescription Glasses","Contact Lenses"}.. Last screening Date: April 2023 Screening is: Up to date. [facility]   Hearing: Denies any issues in Hearing .    Dermatology: Denies any areas of concern on skin. Last screening Date: February 2024 Screening is: Up to date. [facility]       Social Drivers of Health   Financial Resource Strain: High Risk (05/09/2023)   Overall Financial Resource  Strain (CARDIA)    Difficulty of Paying Living Expenses: Very hard  Food Insecurity: Food Insecurity Present (05/09/2023)   Hunger Vital Sign    Worried About Running Out of Food in the Last Year: Often true    Ran Out of Food in the Last Year: Sometimes true  Transportation Needs: No Transportation Needs (05/09/2023)   PRAPARE - Administrator, Civil Service (Medical): No    Lack of Transportation (Non-Medical): No    PHYSICAL EXAM: Vitals:   05/09/23 1043  BP: 123/77  Pulse: 94   Body mass index is 46.86 kg/m. Weight: (!) 123.8 kg (273 lb)   GENERAL: Alert, active, oriented x3  HEENT: Pupils equal reactive to light. Extraocular movements are intact. Sclera clear. Palpebral conjunctiva normal red color.Pharynx clear.  NECK: Supple with no palpable mass and no adenopathy.  LUNGS: Sound clear with no rales rhonchi or wheezes.  HEART: Regular rhythm S1 and S2 without murmur.  ABDOMEN: Soft and depressible, nontender with no palpable mass, no hepatomegaly.   EXTREMITIES: Well-developed well-nourished symmetrical with no dependent edema.  NEUROLOGICAL: Awake alert oriented, facial expression symmetrical, moving all extremities.  REVIEW OF DATA: I have reviewed the following data today: Admission on 05/05/2023, Discharged on 05/05/2023  Component Date Value   Lipase 05/05/2023 26    Sodium 05/05/2023 141    Potassium 05/05/2023 4.2    Chloride 05/05/2023 107    Carbon Dioxide (CO2) 05/05/2023 26    Urea Nitrogen (BUN) 05/05/2023 27 (H)    Creatinine 05/05/2023 1.2 (H)    Glucose 05/05/2023 90    Calcium 05/05/2023 9.3    AST (Aspartate Aminotran* 05/05/2023 18    ALT (Alanine Aminotransf* 05/05/2023 19    Bilirubin, Total 05/05/2023 0.8    Alk Phos (Alkaline Phosp* 05/05/2023 91    Albumin 05/05/2023 3.7    Protein, Total 05/05/2023 7.2    Anion Gap 05/05/2023 8  BUN/CREA Ratio 05/05/2023 22    Glomerular Filtration Ra* 05/05/2023 51    WBC (White  Blood Cell Co* 05/05/2023 7.0    Hemoglobin 05/05/2023 13.2    Hematocrit 05/05/2023 41.5    Platelets 05/05/2023 242    MCV (Mean Corpuscular Vo* 05/05/2023 98    MCH (Mean Corpuscular He* 05/05/2023 31.3    MCHC (Mean Corpuscular H* 05/05/2023 31.8    RBC (Red Blood Cell Coun* 05/05/2023 4.22    RDW-CV (Red Cell Distrib* 05/05/2023 13.3    NRBC (Nucleated Red Bloo* 05/05/2023 0.00    NRBC % (Nucleated Red Bl* 05/05/2023 0.0    MPV (Mean Platelet Volum* 05/05/2023 10.7    Neutrophil Count 05/05/2023 4.6    Neutrophil % 05/05/2023 64.8    Lymphocyte Count 05/05/2023 1.8    Lymphocyte % 05/05/2023 24.9    Monocyte Count 05/05/2023 0.4    Monocyte % 05/05/2023 6.0    Eosinophil Count 05/05/2023 0.24    Eosinophil % 05/05/2023 3.4    Basophil Count 05/05/2023 0.04    Basophil % 05/05/2023 0.6    Immature Granulocyte Cou* 05/05/2023 0.02    Immature Granulocyte % 05/05/2023 0.3   Same Day Visit on 04/20/2023  Component Date Value   Hemoglobin A1C 04/20/2023 5.8 (H)    Average Blood Glucose (C* 04/20/2023 118    WBC (White Blood Cell Co* 04/20/2023 5.6    Hemoglobin 04/20/2023 13.9    Hematocrit 04/20/2023 45.5 (H)    Platelets 04/20/2023 266    MCV (Mean Corpuscular Vo* 04/20/2023 99 (H)    MCH (Mean Corpuscular He* 04/20/2023 30.3    MCHC (Mean Corpuscular H* 04/20/2023 30.5 (L)    RBC (Red Blood Cell Coun* 04/20/2023 4.58    RDW-CV (Red Cell Distrib* 04/20/2023 13.6    NRBC (Nucleated Red Bloo* 04/20/2023 0.00    NRBC % (Nucleated Red Bl* 04/20/2023 0.0    MPV (Mean Platelet Volum* 04/20/2023 11.0    Neutrophil Count 04/20/2023 3.2    Neutrophil % 04/20/2023 56.7    Lymphocyte Count 04/20/2023 1.7    Lymphocyte % 04/20/2023 30.1    Monocyte Count 04/20/2023 0.5    Monocyte % 04/20/2023 8.3    Eosinophil Count 04/20/2023 0.22    Eosinophil % 04/20/2023 4.0    Basophil Count 04/20/2023 0.04    Basophil % 04/20/2023 0.7    Immature Granulocyte Cou* 04/20/2023 0.01     Immature Granulocyte % 04/20/2023 0.2    Thyroid Stimulating Horm* 04/20/2023 0.93    Sodium 04/20/2023 145    Potassium 04/20/2023 4.7    Chloride 04/20/2023 107    Carbon Dioxide (CO2) 04/20/2023 23    Urea Nitrogen (BUN) 04/20/2023 22 (H)    Creatinine 04/20/2023 1.5 (H)    Glucose 04/20/2023 110    Calcium 04/20/2023 9.6    AST (Aspartate Aminotran* 04/20/2023 20    ALT (Alanine Aminotransf* 04/20/2023 23    Bilirubin, Total 04/20/2023 0.7    Alk Phos (Alkaline Phosp* 04/20/2023 89    Albumin 04/20/2023 4.0    Protein, Total 04/20/2023 7.5    Anion Gap 04/20/2023 15 (H)    BUN/CREA Ratio 04/20/2023 15    Glomerular Filtration Ra* 04/20/2023 39    CRP (C-reactive Protein,* 04/20/2023 0.19    Sedimentation Rate-Autom* 04/20/2023 20    NIL 04/20/2023 0.02    TB 1 Antigen 04/20/2023 0.02    TB 2 Antigen 04/20/2023 0.02    Mitogen 04/20/2023 10.00    Quantiferon TB 04/20/2023 Negative  NIL Rflx 04/20/2023 0.02    TB 1 Antigen Rflx 04/20/2023 0.02    TB 2 Antigen Rflx 04/20/2023 0.02    Mitogen Rflx 04/20/2023 10.00    QFT Rflx Instrument Trig* 04/20/2023 1.00   Hospital Outpatient Visit on 03/11/2023  Component Date Value   Creatinine 03/11/2023 1.5 (H)    Glomerular Filtration Ra* 03/11/2023 39    CRP (C-reactive Protein,* 03/11/2023 0.33    Protein, Total 03/11/2023 7.0    Albumin 03/11/2023 3.8    Bilirubin, Total 03/11/2023 1.4    Bilirubin, Conjugated 03/11/2023     Alk Phos (Alkaline Phosp* 03/11/2023 91    AST (Aspartate Aminotran* 03/11/2023 37    ALT (Alanine Aminotransf* 03/11/2023 26    Sedimentation Rate-Autom* 03/11/2023 20    WBC (White Blood Cell Co* 03/11/2023 5.3    Hemoglobin 03/11/2023 13.3    Hematocrit 03/11/2023 42.1    Platelets 03/11/2023 240    MCV (Mean Corpuscular Vo* 03/11/2023 95    MCH (Mean Corpuscular He* 03/11/2023 30.0    MCHC (Mean Corpuscular H* 03/11/2023 31.6    RBC (Red Blood Cell Coun* 03/11/2023 4.43    RDW-CV (Red Cell  Distrib* 03/11/2023 13.8    NRBC (Nucleated Red Bloo* 03/11/2023 0.00    NRBC % (Nucleated Red Bl* 03/11/2023 0.0    MPV (Mean Platelet Volum* 03/11/2023 10.9    Neutrophil Count 03/11/2023 3.1    Neutrophil % 03/11/2023 57.3    Lymphocyte Count 03/11/2023 1.6    Lymphocyte % 03/11/2023 29.0    Monocyte Count 03/11/2023 0.5    Monocyte % 03/11/2023 10.1    Eosinophil Count 03/11/2023 0.15    Eosinophil % 03/11/2023 2.8    Basophil Count 03/11/2023 0.03    Basophil % 03/11/2023 0.6    Immature Granulocyte Cou* 03/11/2023 0.01    Immature Granulocyte % 03/11/2023 0.2      @RESULSEC @  Assessment & Plan Cholelithiasis   Cholelithiasis was identified on a CT scan of the abdomen and pelvis on May 04, 2023, showing gallstones without cholecystitis. She reports severe right flank pain, rated 10/10, which is atypical for gallbladder pain as it is constant and aggravated by movement rather than eating. There is uncertainty about the gallbladder being the pain source due to the lack of inflammation or obstruction on the CT scan. The only definitive treatment is cholecystectomy, but there is no guarantee of pain resolution if unrelated to the gallbladder. She is aware of surgical risks, including anesthesia and potential post-operative pain, and prefers to proceed with surgery. Schedule cholecystectomy as soon as possible, preferably within the week. Provide post-operative instructions and schedule follow-up appointments as needed after surgery.  Bilateral Nephrolithiasis   Non-obstructing bilateral nephrolithiasis was noted on the CT scan, with stones up to 9 mm. Kidney stones were considered as a pain source, but no obstruction or inflammation was noted on the CT scan to typically cause such severe pain. She has a history of kidney stones requiring emergency surgery due to sepsis. Monitor for any changes in symptoms suggesting obstruction or infection. Consult with a nephrologist if pain persists  post-cholecystectomy.  Cirrhosis   Cirrhosis is under the care of a hepatologist. Current liver function tests are normal with no immediate concerns.   Follow-up   She prefers surgery to address the gallbladder issue despite uncertainty about the pain source. Discussed risks and benefits, including the possibility of unresolved pain if unrelated to the gallbladder. Provide post-operative instructions and schedule follow-up appointments as needed after  surgery.  Patient was oriented about the diagnosis of cholelithiasis. Also oriented about what is the gallbladder, its anatomy and function and the implications of having stones. The patient was oriented about the treatment alternatives (observation vs cholecystectomy). Surgical technique (laparoscopic) was discussed. It was also discussed the goals of the surgery (decrease the pain episodes and avoid the risk of cholecystitis) and the risk of surgery including: bleeding, infection, common bile duct injury, stone retention, injury to other organs such as bowel, liver, stomach, other complications such as hernia, bowel obstruction among others. Also discussed with patient about anesthesia and its complications such as: reaction to medications, pneumonia, heart complications, death, among others.   Cholelithiasis without cholecystitis [K80.20]  Patient and and her daughter-in-law verbalized understanding, all questions were answered, and were agreeable with the plan outlined above.     Carolan Shiver, MD  Electronically signed by Carolan Shiver, MD

## 2023-05-11 ENCOUNTER — Encounter: Payer: Self-pay | Admitting: General Surgery

## 2023-05-11 ENCOUNTER — Encounter
Admission: RE | Admit: 2023-05-11 | Discharge: 2023-05-11 | Disposition: A | Discharge status: Home / Self Care | Source: Ambulatory Visit | Attending: General Surgery | Admitting: General Surgery

## 2023-05-11 ENCOUNTER — Telehealth: Payer: Self-pay | Admitting: General Surgery

## 2023-05-11 VITALS — Ht 64.0 in | Wt 272.9 lb

## 2023-05-11 DIAGNOSIS — R109 Unspecified abdominal pain: Secondary | ICD-10-CM

## 2023-05-11 DIAGNOSIS — R8290 Unspecified abnormal findings in urine: Secondary | ICD-10-CM

## 2023-05-11 DIAGNOSIS — I10 Essential (primary) hypertension: Secondary | ICD-10-CM

## 2023-05-11 DIAGNOSIS — Z01810 Encounter for preprocedural cardiovascular examination: Secondary | ICD-10-CM

## 2023-05-11 DIAGNOSIS — E119 Type 2 diabetes mellitus without complications: Secondary | ICD-10-CM

## 2023-05-11 DIAGNOSIS — Z01818 Encounter for other preprocedural examination: Secondary | ICD-10-CM

## 2023-05-11 HISTORY — DX: Vitamin D deficiency, unspecified: E55.9

## 2023-05-11 HISTORY — DX: Type 2 diabetes mellitus without complications: E11.9

## 2023-05-11 HISTORY — DX: Calculus of kidney: N20.0

## 2023-05-11 HISTORY — DX: Calculus of gallbladder without cholecystitis without obstruction: K80.20

## 2023-05-11 HISTORY — DX: Other complications of anesthesia, initial encounter: T88.59XA

## 2023-05-11 HISTORY — DX: Personal history of other infectious and parasitic diseases: Z86.19

## 2023-05-11 HISTORY — DX: Atherosclerosis of aorta: I70.0

## 2023-05-11 HISTORY — DX: Portal hypertension: K76.6

## 2023-05-11 HISTORY — DX: Gastro-esophageal reflux disease without esophagitis: K21.9

## 2023-05-11 HISTORY — DX: Chronic kidney disease, stage 3b: N18.32

## 2023-05-11 HISTORY — DX: Morbid (severe) obesity due to excess calories: E66.01

## 2023-05-11 HISTORY — DX: Personal history of other medical treatment: Z92.89

## 2023-05-11 HISTORY — DX: Sleep apnea, unspecified: G47.30

## 2023-05-11 HISTORY — DX: Other specified postprocedural states: Z98.890

## 2023-05-11 HISTORY — DX: Anemia, unspecified: D64.9

## 2023-05-11 NOTE — Patient Instructions (Signed)
 Your procedure is scheduled on:05-17-23 Wednesday Report to the Registration Desk on the 1st floor of the [facility].Then proceed to the 2nd floor Surgery Desk To find out your arrival time, please call [phone] between 1PM - 3PM on:05-16-23 Tuesday If your arrival time is 6:00 am, do not arrive before that time as the [facility] entrance doors do not open until 6:00 am.  REMEMBER: Instructions that are not followed completely may result in serious medical risk, up to and including death; or upon the discretion of your surgeon and anesthesiologist your surgery may need to be rescheduled.  Do not eat food OR drink any liquids after midnight the night before surgery.  No gum chewing or hard candies.  One week prior to surgery:Stop NOW (05-11-23) Stop Anti-inflammatories (NSAIDS) such as Advil, Aleve, Ibuprofen, Motrin, Naproxen, Naprosyn and Aspirin based products such as Excedrin, Goody's Powder, BC Powder. Stop ANY OVER THE COUNTER supplements until after surgery.  You may however, continue to take Tylenol if needed for pain up until the day of surgery.  Stop tirzepatide [facility]) 7 days prior to surgery-Do NOT take again until AFTER surgery  Continue taking all of your other prescription medications up until the day of surgery.  ON THE DAY OF SURGERY ONLY TAKE THESE MEDICATIONS WITH SIPS OF WATER: -amLODipine (NORVASC)  -buPROPion (WELLBUTRIN XL)  -carvedilol (COREG)  -escitalopram (LEXAPRO)  -ursodiol (ACTIGALL)   No Alcohol for 24 hours before or after surgery.  No Smoking including e-cigarettes for 24 hours before surgery.  No chewable tobacco products for at least 6 hours before surgery.  No nicotine patches on the day of surgery.  Do not use any "recreational" drugs for at least a week (preferably 2 weeks) before your surgery.  Please be advised that the combination of cocaine and anesthesia may have negative outcomes, up to and including death. If you test positive  for cocaine, your surgery will be cancelled.  On the morning of surgery brush your teeth with toothpaste and water, you may rinse your mouth with mouthwash if you wish. Do not swallow any toothpaste or mouthwash.  Use CHG Soap as directed on instruction sheet.  Do not wear jewelry, make-up, hairpins, clips or nail polish.  For welded (permanent) jewelry: bracelets, anklets, waist bands, etc.  Please have this removed prior to surgery.  If it is not removed, there is a chance that hospital personnel will need to cut it off on the day of surgery.  Do not wear lotions, powders, or perfumes.   Do not shave body hair from the neck down 48 hours before surgery.  Contact lenses, hearing aids and dentures may not be worn into surgery.  Do not bring valuables to the hospital. [facility] is not responsible for any missing/lost belongings or valuables.   Notify your doctor if there is any change in your medical condition (cold, fever, infection).  Wear comfortable clothing (specific to your surgery type) to the hospital.  After surgery, you can help prevent lung complications by doing breathing exercises.  Take deep breaths and cough every 1-2 hours. Your doctor may order a device called an Incentive Spirometer to help you take deep breaths. When coughing or sneezing, hold a pillow firmly against your incision with both hands. This is called "splinting." Doing this helps protect your incision. It also decreases belly discomfort.  If you are being admitted to the hospital overnight, leave your suitcase in the car. After surgery it may be brought to your room.  In case of increased patient census, it may be necessary for you, the patient, to continue your postoperative care in the Same Day Surgery department.  If you are being discharged the day of surgery, you will not be allowed to drive home. You will need a responsible individual to drive you home and stay with you for 24 hours after surgery.    If you are taking public transportation, you will need to have a responsible individual with you.  Please call the Pre-admissions Testing Dept. at [phone] if you have any questions about these instructions.  Surgery Visitation Policy:  Patients having surgery or a procedure may have two visitors.  Children under the age of 44 must have an adult with them who is not the patient.  Temporary Visitor Restrictions Due to increasing cases of flu, RSV and COVID-19: Children ages 48 and under will not be able to visit patients in [facility] hospitals under most circumstances.     Preparing for Surgery with CHLORHEXIDINE GLUCONATE (CHG) Soap  Chlorhexidine Gluconate (CHG) Soap  o An antiseptic cleaner that kills germs and bonds with the skin to continue killing germs even after washing  o Used for showering the night before surgery and morning of surgery  Before surgery, you can play an important role by reducing the number of germs on your skin.  CHG (Chlorhexidine gluconate) soap is an antiseptic cleanser which kills germs and bonds with the skin to continue killing germs even after washing.  Please do not use if you have an allergy to CHG or antibacterial soaps. If your skin becomes reddened/irritated stop using the CHG.  1. Shower the NIGHT BEFORE SURGERY and the MORNING OF SURGERY with CHG soap.  2. If you choose to wash your hair, wash your hair first as usual with your normal shampoo.  3. After shampooing, rinse your hair and body thoroughly to remove the shampoo.  4. Use CHG as you would any other liquid soap. You can apply CHG directly to the skin and wash gently with a scrungie or a clean washcloth.  5. Apply the CHG soap to your body only from the neck down. Do not use on open wounds or open sores. Avoid contact with your eyes, ears, mouth, and genitals (private parts). Wash face and genitals (private parts) with your normal soap.  6. Wash thoroughly, paying  special attention to the area where your surgery will be performed.  7. Thoroughly rinse your body with warm water.  8. Do not shower/wash with your normal soap after using and rinsing off the CHG soap.  9. Pat yourself dry with a clean towel.  10. Wear clean pajamas to bed the night before surgery.  12. Place clean sheets on your bed the night of your first shower and do not sleep with pets.  13. Shower again with the CHG soap on the day of surgery prior to arriving at the hospital.  14. Do not apply any deodorants/lotions/powders.  15. Please wear clean clothes to the hospital.

## 2023-05-11 NOTE — Telephone Encounter (Signed)
 Patient with pain 10 out of 10 from a cholelithiasis.  Due to his complaint I tried to urgently schedule the patient on 05/12/1923.  Anesthesia canceled the case due to patient taking Mounjaro.  Hopefully patient will not return to the ED with pain and need emergent surgery.  As per anesthesia patient will not be able to have elective surgery until 05/17/2023.

## 2023-05-12 ENCOUNTER — Encounter
Admission: RE | Admit: 2023-05-12 | Discharge: 2023-05-12 | Disposition: A | Discharge status: Home / Self Care | Source: Ambulatory Visit | Attending: General Surgery | Admitting: General Surgery

## 2023-05-12 VITALS — BP 128/79 | HR 77 | Temp 97.8°F | Resp 16

## 2023-05-12 DIAGNOSIS — Z01810 Encounter for preprocedural cardiovascular examination: Secondary | ICD-10-CM

## 2023-05-12 DIAGNOSIS — E119 Type 2 diabetes mellitus without complications: Secondary | ICD-10-CM | POA: Insufficient documentation

## 2023-05-12 DIAGNOSIS — I10 Essential (primary) hypertension: Secondary | ICD-10-CM | POA: Diagnosis not present

## 2023-05-12 DIAGNOSIS — Z01818 Encounter for other preprocedural examination: Secondary | ICD-10-CM | POA: Insufficient documentation

## 2023-05-12 DIAGNOSIS — R109 Unspecified abdominal pain: Secondary | ICD-10-CM | POA: Diagnosis not present

## 2023-05-12 LAB — URINALYSIS, COMPLETE (UACMP) WITH MICROSCOPIC
Bacteria, UA: NONE SEEN
Bilirubin Urine: NEGATIVE
Glucose, UA: NEGATIVE mg/dL
Hgb urine dipstick: NEGATIVE
Ketones, ur: NEGATIVE mg/dL
Nitrite: NEGATIVE
Protein, ur: NEGATIVE mg/dL
Specific Gravity, Urine: 1.017 (ref 1.005–1.030)
pH: 5 (ref 5.0–8.0)

## 2023-05-12 NOTE — Progress Notes (Addendum)
 Perioperative Services Pre-Admission/Anesthesia Testing    Date: 05/11/2023  Name: Olivia Schroeder MRN:   [id number]  Re: Plans for surgery  Planned Surgical Procedure(s):    Case: [id number] Date/Time: 05/12/2023   Procedure: CHOLECYSTECTOMY, ROBOT-ASSISTED, LAPAROSCOPIC (Abdomen)   Anesthesia type: General   Pre-op diagnosis: K80.20 Cholelithiasis w/o cholecystitis   Location: [facility] OR ROOM 06 / [facility] ORS FOR ANESTHESIA GROUP   Surgeons: Carolan Shiver, MD   Clinical Notes:  Patient is scheduled for the above procedure on 05/12/2023 with Dr. Carolan Shiver, MD.  Received request for add on PAT appointment for this patient; request accommodated and patient was added on for 05/11/2023 for a 05/12/2023 procedure. During the course of the interview, it was discovered that patient is on prescribed GLP-1/GIP therapy using tirzepatide, The patient reports that she last took this medication on 05/08/2023. PAT RN made patient aware of the fact that these medications place her at increased risk of aspiration during surgery due to their intended mechanism of action (delayed gastric emptying/motility). Patient reports that the office was aware that she was on tirzepatide, however Dr. Vedia Coffer nurse said to her that "hopefully we (anesthesia) will let it slide".   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 Patient was directed to contact surgeon's office to discuss having case rescheduled. Patient was advised that per ASA guidelines, her surgery would need to be postponed for 7 days following her last tirzepatide injection, which would make her eligible for surgery on 05/15/2023. Again, PAT staff reviewed her increased risk of aspiration, which patient acknowledged and understood.   Concerned PAT  RN brought information to myself as the PAT APP for review. Chart briefly reviewed in efforts to address concerns. Based on the following, case was felt to be more of an elective case. With that said, the offices are aware of the caveat that if procedure is felt to be urgent/emergent, surgeon could/should contact anesthesia directly to discuss clinical concerns.   Patient experiencing pain in her RIGHT flank area. She did not complain of any associated nausea, vomiting, change in bowel habits, or fevers. Patient able to eat and drink normally.    Labs from 05/04/2023 and 05/05/2023 reviewed. Patient with no leukocytosis. All electrolytes were normal. LFTs and lipase levels were normal. There was a mild decreased in her renal function (BUN 27 and creatinine 1.2 mg/dL), which was felt to be at baseline for patient with a documented diagnosis of CKD-III.   CT imaging and radiology interpretation also reviewed by me. Imaging report indicated no intra- or extrahepatic biliary dilatation.  Cholelithiasis were noted, however there was obvious evidence of overt cholecystitis or choledocholithiasis. Imaging report also demonstrated nonobstructive BILATERAL nephrolithiasis measuring up to 9 mm  MD notes from the office indicated that "there is uncertainty about the gallbladder being the pain source due to lack of inflammation or obstruction on the CT scan. The only definitive treatment is cholecystectomy, but there is no guarantee of the pain resolution if unrelated to the gallbladder".  PAT RN was in the process of contacting office when she noted that scheduled surgery date had already been changed to 05/17/2023. PAT RN contacted patient again to complete PAT interview. Patient stated to PAT RN that "she was having pain and was ready to get it taken care of". PAT RN notes that patient was laughing throughout her telephone  interview. Given close proximity of surgical date, remaining portions of the PAT interview  were completed and patient was scheduled to come into the office the following day (05/12/2023)  to complete her outstanding preoperative requirements (EKG).   ADDENDUM 05/12/2023 at 10:55 AM: Patient in to PAT department today for preoperative ECG. Patient smiling and laughing during her time in the department. She appeared non-toxic and in NAD. Patient reports that she maintains the ability to eat and drink well without experiencing any nausea or vomiting. She has not experienced any fevers/chills at home.   VS were assessed while in clinic: BP: 128/79 mmHg HR: 77 bpm RR: 18 TEMP: 97.8 PAIN: 10/10 in the RIGHT lower back wrapping circumferentially around into flank area. Patient denies RUQ and epigastric pain. Pain described initially as a "soreness", but with further investigation, patient indicated that the pain was sharp/stabbing.   ECG: Date: 05/12/2023 Time ECG obtained: 1006 AM Rate: 70 bpm Rhythm: Sinus rhythm with 1st degree AV block Axis (leads I and aVF): normal Intervals: PR 226 ms. QRS 90 ms. QTc 449 ms. ST segment and T wave changes: No evidence of acute T wave abnormalities or significant ST segment elevation or depression.  Evidence of a possible, age undetermined, prior infarct:  No Comparison: Similar to previous tracing obtained on 02/02/2022  Given the location that patient is reporting, coupled with the quality of her pain, I reviewed urine from when patient was in the ED. Clinical concerns for possible infection, as there was moderate LE and 21-50 WBC/hpf noted in the sample. Of note, sample provided in the ED was not reflexed to culture at that time, as sample was contaminated with 11-20 squamous epithelial cells/HPF. Patient stated that she was provided with "a hat" in the ED, however she was not given towelettes with which to cleanse her perineum/urinary meatus prior to providing the sample. Rather than a clean catch specimen, this meant that patient provided a random  voided sample.   Based on her reports, there is potentially some urinary colic associated with underlying urinary tract infection vs movement of noted urinary stones. In efforts to rule out presence of infection and frank hematuria as being contributory to her pain, repeat UA sent today that showed improvement. Trace LE with 11-20 WBC/hpf was observed. No microscopic hematuria or nitrites noted. Despite instruction on sample collection, sample still had 6-10 squamous epithelial/HPF cells present. There was no bacteria observed. Given the LE, coupled with the patient's symptoms, will send sample for culture to ensure that clinically, urine is ruled out as being contributory.   Quentin Mulling, MSN, APRN, FNP-C, CEN [facility]  Perioperative Services Nurse Practitioner Phone: [phone] Fax: [phone]  NOTE: This note has been prepared using Dragon dictation software. Despite my best ability to proofread, there is always the potential that unintentional transcriptional errors may still occur from this process.

## 2023-05-14 LAB — URINE CULTURE

## 2023-05-14 NOTE — Progress Notes (Signed)
 [facility] Perioperative Services: Pre-Admission/Anesthesia Testing  Abnormal Lab Notification and Treatment Plan of Care   Date: 05/14/23  Name: Olivia Schroeder MRN:   [id number]  Re: Abnormal labs noted during PAT appointment   Notified:  Provider Name Provider Role Notification Mode  Carolan Shiver, MD  General Surgery (Surgeon) Routed and/or faxed via [facility]   Abnormal Lab Value(s):   Lab Results  Component Value Date   COLORURINE YELLOW (A) 05/12/2023   APPEARANCEUR HAZY (A) 05/12/2023   LABSPEC 1.017 05/12/2023   PHURINE 5.0 05/12/2023   GLUCOSEU NEGATIVE 05/12/2023   HGBUR NEGATIVE 05/12/2023   BILIRUBINUR NEGATIVE 05/12/2023   KETONESUR NEGATIVE 05/12/2023   PROTEINUR NEGATIVE 05/12/2023   NITRITE NEGATIVE 05/12/2023   LEUKOCYTESUR TRACE (A) 05/12/2023   EPIU 6-10 05/12/2023   WBCU 11-20 05/12/2023   RBCU 0-5 05/12/2023   BACTERIA NONE SEEN 05/12/2023   CULT (A) 05/12/2023    80,000 COLONIES/mL LACTOBACILLUS SPECIES Standardized susceptibility testing for this organism is not available. Performed at [facility], [address], [place], [place] [address]     Clinical Information and Notes:  Patient is scheduled for CHOLECYSTECTOMY, ROBOT-ASSISTED, LAPAROSCOPIC (Abdomen) on 03/26/205.    UA performed in PAT consistent with/concerning for infection.  No leukocytosis noted on CBC; WBC 6.2 Renal function: Estimated Creatinine Clearance: 54.1 mL/min (A) (by C-G formula based on SCr of 1.4 mg/dL (H)). Urine C&S added to assess for pathogenically significant growth.  Impression and Plan:  Olivia Schroeder with a UA that, when viewed in conjunction with her reported symptoms, was concerning for potential early infection. Sample was grossly contaminated in the ED, therefore new sample was collected on 05/12/2023 and reflexed to culture. Culture grew out 80,000 CFU/mL Lactobacillis, which is not overly alarming. This pathogen is  generally viewed as a vaginal contaminant in immunocompetent females. Despite instructions on clean catch collection techniques, sample provided in PAT also had a good amount of squamous epithelial/HPF (6-10) present, this making contamination more likely.   As previously stated, Lactobacillis is a very common part of the normal female urogenital flora. It is rarely the causative pathogen on true urinary tract infections.Lactobacillis is an acid producing pathogen. Urinary pH on the lower side of normal in the patient provided sample (5.0); still within normal range of 5.0-8.0. This seems to be patient's baseline urinary pH when comparing PAT sample to the results from prior urinary testing. This particular bacteria does not reduce nitrates to nitrites, thus the absence of nitrites in her provided sample is not surprising. When there is overgrowth of the pathogen, cytolytic vaginosis (CV) could be considered. That said, this condition is as equally as rare as UTI caused by Lactobacillis, and is generally seen when pathogenically significant colony counts exceed 100,000 CFU/mL. Patient is not experiencing any significant vaginal symptoms at this time, therefore I feel as if CV can be effectively ruled out as part of the DDx.   Again, I believe that we are looking at a case of a sample that was contaminated by a pathogen found as part of the normal female urogenital flora. In the setting of patient with upcoming elective surgery in the near future, will forward results to Dr. Hazle Quant, MD for review to ensure that he feels the same. In the event that MD would like to explore a trial of treatment, we can always contact the patient to discuss treatment options prior to upcoming surgery.   Quentin Mulling, MSN, APRN, FNP-C, CEN [facility]  Perioperative Services Nurse Practitioner Phone: [phone] Fax: [phone] 05/14/23 5:36 PM  NOTE: This note has been prepared using Dragon  dictation software. Despite my best ability to proofread, there is always the potential that unintentional transcriptional errors may still occur from this process.

## 2023-05-17 ENCOUNTER — Other Ambulatory Visit: Payer: Self-pay

## 2023-05-17 ENCOUNTER — Ambulatory Visit: Payer: Self-pay | Admitting: Urgent Care

## 2023-05-17 ENCOUNTER — Encounter
Admission: RE | Disposition: A | Discharge status: Home / Self Care | Payer: Self-pay | Source: Home / Self Care | Attending: General Surgery

## 2023-05-17 ENCOUNTER — Encounter: Payer: Self-pay | Admitting: General Surgery

## 2023-05-17 ENCOUNTER — Ambulatory Visit
Admission: RE | Admit: 2023-05-17 | Discharge: 2023-05-17 | Disposition: A | Discharge status: Home / Self Care | Attending: General Surgery | Admitting: General Surgery

## 2023-05-17 DIAGNOSIS — I129 Hypertensive chronic kidney disease with stage 1 through stage 4 chronic kidney disease, or unspecified chronic kidney disease: Secondary | ICD-10-CM | POA: Diagnosis not present

## 2023-05-17 DIAGNOSIS — K766 Portal hypertension: Secondary | ICD-10-CM | POA: Insufficient documentation

## 2023-05-17 DIAGNOSIS — Z01812 Encounter for preprocedural laboratory examination: Secondary | ICD-10-CM

## 2023-05-17 DIAGNOSIS — G4733 Obstructive sleep apnea (adult) (pediatric): Secondary | ICD-10-CM | POA: Insufficient documentation

## 2023-05-17 DIAGNOSIS — R8290 Unspecified abnormal findings in urine: Secondary | ICD-10-CM

## 2023-05-17 DIAGNOSIS — K7460 Unspecified cirrhosis of liver: Secondary | ICD-10-CM | POA: Diagnosis not present

## 2023-05-17 DIAGNOSIS — K8010 Calculus of gallbladder with chronic cholecystitis without obstruction: Secondary | ICD-10-CM | POA: Diagnosis present

## 2023-05-17 DIAGNOSIS — N200 Calculus of kidney: Secondary | ICD-10-CM | POA: Insufficient documentation

## 2023-05-17 DIAGNOSIS — N1832 Chronic kidney disease, stage 3b: Secondary | ICD-10-CM | POA: Insufficient documentation

## 2023-05-17 DIAGNOSIS — E1122 Type 2 diabetes mellitus with diabetic chronic kidney disease: Secondary | ICD-10-CM | POA: Insufficient documentation

## 2023-05-17 DIAGNOSIS — R109 Unspecified abdominal pain: Secondary | ICD-10-CM

## 2023-05-17 DIAGNOSIS — Z01818 Encounter for other preprocedural examination: Secondary | ICD-10-CM

## 2023-05-17 LAB — GLUCOSE, CAPILLARY
Glucose-Capillary: 99 mg/dL (ref 70–99)
Glucose-Capillary: 158 mg/dL — ABNORMAL HIGH (ref 70–99)

## 2023-05-17 SURGERY — CHOLECYSTECTOMY, ROBOT-ASSISTED, LAPAROSCOPIC
Anesthesia: General | Site: Abdomen

## 2023-05-17 MED ORDER — ORAL CARE MOUTH RINSE: 15.0000 mL | Freq: Once | OROMUCOSAL | Status: AC

## 2023-05-17 MED ORDER — FENTANYL CITRATE (PF) 100 MCG/2ML IJ SOLN
INTRAMUSCULAR | Status: AC
  Filled 2023-05-17: qty 2

## 2023-05-17 MED ORDER — FENTANYL CITRATE (PF) 100 MCG/2ML IJ SOLN
25.0000 ug | INTRAMUSCULAR | Status: DC | PRN
  Administered 2023-05-17 (×2): 50 ug via INTRAVENOUS

## 2023-05-17 MED ORDER — VANCOMYCIN HCL IN DEXTROSE 1-5 GM/200ML-% IV SOLN
1000.0000 mg | INTRAVENOUS | Status: AC
  Administered 2023-05-17: 1000 mg via INTRAVENOUS

## 2023-05-17 MED ORDER — ONDANSETRON HCL 4 MG/2ML IJ SOLN
INTRAMUSCULAR | Status: AC
  Filled 2023-05-17: qty 2

## 2023-05-17 MED ORDER — SUGAMMADEX SODIUM 200 MG/2ML IV SOLN
INTRAVENOUS | Status: DC | PRN
  Administered 2023-05-17: 400 mg via INTRAVENOUS

## 2023-05-17 MED ORDER — DEXAMETHASONE SODIUM PHOSPHATE 10 MG/ML IJ SOLN
INTRAMUSCULAR | Status: DC | PRN
  Administered 2023-05-17: 10 mg via INTRAVENOUS

## 2023-05-17 MED ORDER — CHLORHEXIDINE GLUCONATE 0.12 % MT SOLN
OROMUCOSAL | Status: AC
  Filled 2023-05-17: qty 15

## 2023-05-17 MED ORDER — MIDAZOLAM HCL 2 MG/2ML IJ SOLN
INTRAMUSCULAR | Status: AC
  Filled 2023-05-17: qty 2

## 2023-05-17 MED ORDER — ROCURONIUM BROMIDE 100 MG/10ML IV SOLN
INTRAVENOUS | Status: DC | PRN
  Administered 2023-05-17: 70 mg via INTRAVENOUS

## 2023-05-17 MED ORDER — BUPIVACAINE-EPINEPHRINE 0.25% -1:200000 IJ SOLN
INTRAMUSCULAR | Status: DC | PRN
  Administered 2023-05-17: 10 mL

## 2023-05-17 MED ORDER — SODIUM CHLORIDE 0.9 % IV SOLN: INTRAVENOUS | Status: DC

## 2023-05-17 MED ORDER — SUGAMMADEX SODIUM 200 MG/2ML IV SOLN
INTRAVENOUS | Status: AC
Start: 2023-05-17 — End: ?
  Filled 2023-05-17: qty 2

## 2023-05-17 MED ORDER — INDOCYANINE GREEN 25 MG IV SOLR
1.2500 mg | Freq: Once | INTRAVENOUS | Status: AC
  Administered 2023-05-17: 1.25 mg via INTRAVENOUS

## 2023-05-17 MED ORDER — MIDAZOLAM HCL 2 MG/2ML IJ SOLN
INTRAMUSCULAR | Status: DC | PRN
Start: 2023-05-17 — End: 2023-05-17
  Administered 2023-05-17: 2 mg via INTRAVENOUS

## 2023-05-17 MED ORDER — SUGAMMADEX SODIUM 200 MG/2ML IV SOLN
INTRAVENOUS | Status: AC
  Filled 2023-05-17: qty 2

## 2023-05-17 MED ORDER — LIDOCAINE HCL (PF) 2 % IJ SOLN
INTRAMUSCULAR | Status: AC
  Filled 2023-05-17: qty 5

## 2023-05-17 MED ORDER — BUPIVACAINE-EPINEPHRINE (PF) 0.25% -1:200000 IJ SOLN
INTRAMUSCULAR | Status: AC
  Filled 2023-05-17: qty 30

## 2023-05-17 MED ORDER — OXYCODONE HCL 5 MG/5ML PO SOLN: 5.0000 mg | Freq: Once | ORAL | Status: AC | PRN

## 2023-05-17 MED ORDER — LIDOCAINE HCL (CARDIAC) PF 100 MG/5ML IV SOSY
PREFILLED_SYRINGE | INTRAVENOUS | Status: DC | PRN
  Administered 2023-05-17: 100 mg via INTRAVENOUS

## 2023-05-17 MED ORDER — 0.9 % SODIUM CHLORIDE (POUR BTL) OPTIME
TOPICAL | Status: DC | PRN
  Administered 2023-05-17: 500 mL

## 2023-05-17 MED ORDER — VANCOMYCIN HCL IN DEXTROSE 1-5 GM/200ML-% IV SOLN
INTRAVENOUS | Status: AC
  Filled 2023-05-17: qty 200

## 2023-05-17 MED ORDER — OXYCODONE HCL 5 MG PO TABS
ORAL_TABLET | ORAL | Status: AC
  Filled 2023-05-17: qty 1

## 2023-05-17 MED ORDER — CHLORHEXIDINE GLUCONATE 0.12 % MT SOLN
15.0000 mL | Freq: Once | OROMUCOSAL | Status: AC
  Administered 2023-05-17: 15 mL via OROMUCOSAL

## 2023-05-17 MED ORDER — PROPOFOL 1000 MG/100ML IV EMUL
INTRAVENOUS | Status: AC
  Filled 2023-05-17: qty 100

## 2023-05-17 MED ORDER — DEXAMETHASONE SODIUM PHOSPHATE 10 MG/ML IJ SOLN
INTRAMUSCULAR | Status: AC
  Filled 2023-05-17: qty 1

## 2023-05-17 MED ORDER — ACETAMINOPHEN 10 MG/ML IV SOLN
INTRAVENOUS | Status: DC | PRN
  Administered 2023-05-17 (×2): 1000 mg via INTRAVENOUS

## 2023-05-17 MED ORDER — HYDROCODONE-ACETAMINOPHEN 5-325 MG PO TABS: 1.0000 | ORAL_TABLET | Freq: Four times a day (QID) | ORAL | 0 refills | Status: AC | PRN

## 2023-05-17 MED ORDER — ROCURONIUM BROMIDE 10 MG/ML (PF) SYRINGE
PREFILLED_SYRINGE | INTRAVENOUS | Status: AC
  Filled 2023-05-17: qty 10

## 2023-05-17 MED ORDER — OXYCODONE HCL 5 MG PO TABS
5.0000 mg | ORAL_TABLET | Freq: Once | ORAL | Status: AC | PRN
  Administered 2023-05-17: 5 mg via ORAL

## 2023-05-17 MED ORDER — ONDANSETRON HCL 4 MG/2ML IJ SOLN
INTRAMUSCULAR | Status: DC | PRN
  Administered 2023-05-17: 4 mg via INTRAVENOUS

## 2023-05-17 MED ORDER — FENTANYL CITRATE (PF) 100 MCG/2ML IJ SOLN
INTRAMUSCULAR | Status: DC | PRN
  Administered 2023-05-17 (×2): 50 ug via INTRAVENOUS

## 2023-05-17 MED ORDER — PROPOFOL 10 MG/ML IV BOLUS
INTRAVENOUS | Status: DC | PRN
  Administered 2023-05-17: 200 mg via INTRAVENOUS
  Administered 2023-05-17: 100 ug/kg/min via INTRAVENOUS

## 2023-05-17 SURGICAL SUPPLY — 47 items
BAG PRESSURE INF REUSE 1000 (BAG) IMPLANT
CANNULA REDUCER 12-8 DVNC XI (CANNULA) ×1 IMPLANT
CATH REDDICK CHOLANGI 4FR 50CM (CATHETERS) IMPLANT
CAUTERY HOOK MNPLR 1.6 DVNC XI (INSTRUMENTS) ×1 IMPLANT
CLIP LIGATING HEM O LOK PURPLE (MISCELLANEOUS) IMPLANT
CLIP LIGATING HEMO O LOK GREEN (MISCELLANEOUS) ×1 IMPLANT
DERMABOND ADVANCED .7 DNX12 (GAUZE/BANDAGES/DRESSINGS) ×1 IMPLANT
DRAPE ARM DVNC X/XI (DISPOSABLE) ×4 IMPLANT
DRAPE C-ARM XRAY 36X54 (DRAPES) IMPLANT
DRAPE COLUMN DVNC XI (DISPOSABLE) ×1 IMPLANT
ELECT REM PT RETURN 9FT ADLT (ELECTROSURGICAL) ×1 IMPLANT
ELECTRODE REM PT RTRN 9FT ADLT (ELECTROSURGICAL) ×1 IMPLANT
FORCEPS BPLR 8 MD DVNC XI (FORCEP) ×1 IMPLANT
FORCEPS BPLR FENES DVNC XI (FORCEP) ×1 IMPLANT
FORCEPS PROGRASP DVNC XI (FORCEP) ×1 IMPLANT
GLOVE BIO SURGEON STRL SZ 6.5 (GLOVE) ×2 IMPLANT
GLOVE BIOGEL PI IND STRL 6.5 (GLOVE) ×2 IMPLANT
GOWN STRL REUS W/ TWL LRG LVL3 (GOWN DISPOSABLE) ×3 IMPLANT
GRASPER SUT TROCAR 14GX15 (MISCELLANEOUS) ×1 IMPLANT
IRRIGATOR SUCT 8 DISP DVNC XI (IRRIGATION / IRRIGATOR) IMPLANT
IV CATH ANGIO 12GX3 LT BLUE (NEEDLE) IMPLANT
IV NS 1000ML BAXH (IV SOLUTION) IMPLANT
KIT PINK PAD W/HEAD ARE REST (MISCELLANEOUS) ×1 IMPLANT
KIT PINK PAD W/HEAD ARM REST (MISCELLANEOUS) ×1 IMPLANT
LABEL OR SOLS (LABEL) ×1 IMPLANT
MANIFOLD NEPTUNE II (INSTRUMENTS) ×1 IMPLANT
NDL HYPO 22X1.5 SAFETY MO (MISCELLANEOUS) ×1 IMPLANT
NDL INSUFFLATION 14GA 120MM (NEEDLE) ×1 IMPLANT
NEEDLE HYPO 22X1.5 SAFETY MO (MISCELLANEOUS) ×1 IMPLANT
NEEDLE INSUFFLATION 14GA 120MM (NEEDLE) ×1 IMPLANT
NS IRRIG 500ML POUR BTL (IV SOLUTION) ×1 IMPLANT
OBTURATOR OPTICAL STND 8 DVNC (TROCAR) ×1 IMPLANT
OBTURATOR OPTICALSTD 8 DVNC (TROCAR) ×1 IMPLANT
PACK LAP CHOLECYSTECTOMY (MISCELLANEOUS) ×1 IMPLANT
SEAL UNIV 5-12 XI (MISCELLANEOUS) ×4 IMPLANT
SET TUBE SMOKE EVAC HIGH FLOW (TUBING) ×1 IMPLANT
SOL ELECTROSURG ANTI STICK (MISCELLANEOUS) ×1 IMPLANT
SOLUTION ELECTROSURG ANTI STCK (MISCELLANEOUS) ×1 IMPLANT
SPIKE FLUID TRANSFER (MISCELLANEOUS) ×2 IMPLANT
SPONGE T-LAP 4X18 ~~LOC~~+RFID (SPONGE) IMPLANT
SUT MNCRL 4-0 27 PS-2 XMFL (SUTURE) ×1 IMPLANT
SUT VICRYL 0 UR6 27IN ABS (SUTURE) ×1 IMPLANT
SUTURE MNCRL 4-0 27XMF (SUTURE) ×1 IMPLANT
SYS BAG RETRIEVAL 10MM (BASKET) ×1 IMPLANT
SYSTEM BAG RETRIEVAL 10MM (BASKET) ×1 IMPLANT
TRAP FLUID SMOKE EVACUATOR (MISCELLANEOUS) ×1 IMPLANT
WATER STERILE IRR 500ML POUR (IV SOLUTION) ×1 IMPLANT

## 2023-05-17 NOTE — Anesthesia Preprocedure Evaluation (Signed)
 Anesthesia Evaluation  Patient identified by MRN, date of birth, ID band Patient awake    Reviewed: Allergy & Precautions, NPO status , Patient's Chart, lab work & pertinent test results  History of Anesthesia Complications (+) PONV, PROLONGED EMERGENCE and history of anesthetic complications  Airway Mallampati: III  TM Distance: <3 FB Neck ROM: full    Dental  (+) Chipped   Pulmonary neg shortness of breath, sleep apnea    Pulmonary exam normal        Cardiovascular Exercise Tolerance: Good hypertension, (-) angina (-) Past MI Normal cardiovascular exam     Neuro/Psych negative neurological ROS  negative psych ROS   GI/Hepatic Neg liver ROS,GERD  Controlled,,  Endo/Other  diabetes, Type 2    Renal/GU Renal disease     Musculoskeletal   Abdominal   Peds  Hematology negative hematology ROS (+)   Anesthesia Other Findings Past Medical History: No date: Anemia No date: Aortic atherosclerosis (HCC) No date: Bilateral nephrolithiasis No date: Cholelithiasis No date: Cirrhosis of liver (HCC) No date: CKD stage 3b, GFR 30-44 ml/min (HCC) No date: Complication of anesthesia     Comment:  delayed emergence, trouble breathing in PACU 2024: COVID-19 No date: Depression No date: DM (diabetes mellitus), type 2 (HCC) No date: GERD (gastroesophageal reflux disease)     Comment:  rare-no meds No date: Hidradenitis No date: History of blood transfusion No date: History of sepsis No date: Hypertension No date: Morbid obesity (HCC) 2023: Pericardial effusion 2024: Pneumonia     Comment:  covid pneumonia No date: PONV (postoperative nausea and vomiting)     Comment:  nausea only No date: Portal hypertension (HCC) No date: Sleep apnea     Comment:  does not use cpap No date: Vitamin D deficiency  Past Surgical History: 10/01/2014: BREAST BIOPSY; Left     Comment:  neg 1994: CESAREAN SECTION No date:  COLONOSCOPY No date: INGUINAL HIDRADENITIS EXCISION No date: KIDNEY STONE SURGERY No date: LAPAROSCOPIC GASTRIC SLEEVE RESECTION 2014: REDUCTION MAMMAPLASTY; Bilateral No date: TONSILLECTOMY  BMI    Body Mass Index: 46.69 kg/m      Reproductive/Obstetrics negative OB ROS                             Anesthesia Physical Anesthesia Plan  ASA: 3  Anesthesia Plan: General ETT   Post-op Pain Management:    Induction: Intravenous  PONV Risk Score and Plan: Ondansetron, Dexamethasone, Midazolam and Treatment may vary due to age or medical condition  Airway Management Planned: Oral ETT  Additional Equipment:   Intra-op Plan:   Post-operative Plan: Extubation in OR  Informed Consent: I have reviewed the patients History and Physical, chart, labs and discussed the procedure including the risks, benefits and alternatives for the proposed anesthesia with the patient or authorized representative who has indicated his/her understanding and acceptance.     Dental Advisory Given  Plan Discussed with: Anesthesiologist, CRNA and Surgeon  Anesthesia Plan Comments: (Patient consented for risks of anesthesia including but not limited to:  - adverse reactions to medications - damage to eyes, teeth, lips or other oral mucosa - nerve damage due to positioning  - sore throat or hoarseness - Damage to heart, brain, nerves, lungs, other parts of body or loss of life  Patient voiced understanding and assent.)       Anesthesia Quick Evaluation

## 2023-05-17 NOTE — Transfer of Care (Signed)
 Immediate Anesthesia Transfer of Care Note  Patient: Olivia Schroeder  Procedure(s) Performed: CHOLECYSTECTOMY, ROBOT-ASSISTED, LAPAROSCOPIC (Abdomen)  Patient Location: PACU  Anesthesia Type:General  Level of Consciousness: awake  Airway & Oxygen Therapy: Patient Spontanous Breathing and Patient connected to face mask oxygen  Post-op Assessment: Report given to RN and Post -op Vital signs reviewed and stable  Post vital signs: Reviewed and stable  Last Vitals:  Vitals Value Taken Time  BP 93/52 05/17/23 1155  Temp 36.3 C 05/17/23 1155  Pulse 63 05/17/23 1158  Resp 21 05/17/23 1158  SpO2 100 % 05/17/23 1158  Vitals shown include unfiled device data.  Last Pain:  Vitals:   05/17/23 1155  TempSrc:   PainSc: 0-No pain         Complications: There were no known notable events for this encounter.

## 2023-05-17 NOTE — Anesthesia Postprocedure Evaluation (Signed)
 Anesthesia Post Note  Patient: Olivia Schroeder  Procedure(s) Performed: CHOLECYSTECTOMY, ROBOT-ASSISTED, LAPAROSCOPIC (Abdomen)  Patient location during evaluation: PACU Anesthesia Type: General Level of consciousness: awake and alert Pain management: pain level controlled Vital Signs Assessment: post-procedure vital signs reviewed and stable Respiratory status: spontaneous breathing, nonlabored ventilation, respiratory function stable and patient connected to nasal cannula oxygen Cardiovascular status: blood pressure returned to baseline and stable Postop Assessment: no apparent nausea or vomiting Anesthetic complications: no   There were no known notable events for this encounter.   Last Vitals:  Vitals:   05/17/23 1315 05/17/23 1337  BP: (!) 95/53 102/60  Pulse: 60 62  Resp:  16  Temp:  (!) 36.4 C  SpO2: 100% 100%    Last Pain:  Vitals:   05/17/23 1337  TempSrc: Tympanic  PainSc: 2                  Cleda Mccreedy Glorious Flicker

## 2023-05-17 NOTE — Interval H&P Note (Signed)
 History and Physical Interval Note:  05/17/2023 10:01 AM  Olivia Schroeder  has presented today for surgery, with the diagnosis of K80.20 Cholelithiasis w/o cholecystitis.  The various methods of treatment have been discussed with the patient and family. After consideration of risks, benefits and other options for treatment, the patient has consented to  Procedure(s): CHOLECYSTECTOMY, ROBOT-ASSISTED, LAPAROSCOPIC (N/A) as a surgical intervention.  The patient's history has been reviewed, patient examined, no change in status, stable for surgery.  I have reviewed the patient's chart and labs.  Questions were answered to the patient's satisfaction.     Carolan Shiver

## 2023-05-17 NOTE — Discharge Instructions (Addendum)

## 2023-05-17 NOTE — Anesthesia Procedure Notes (Signed)
 Procedure Name: Intubation Date/Time: 05/17/2023 10:33 AM  Performed by: Lysbeth Penner, CRNAPre-anesthesia Checklist: Patient identified, Emergency Drugs available, Suction available and Patient being monitored Patient Re-evaluated:Patient Re-evaluated prior to induction Oxygen Delivery Method: Circle system utilized Preoxygenation: Pre-oxygenation with 100% oxygen Induction Type: IV induction Ventilation: Mask ventilation without difficulty Laryngoscope Size: McGrath and 3 Grade View: Grade I Tube type: Oral Tube size: 6.5 mm Number of attempts: 1 Airway Equipment and Method: Stylet and Oral airway Placement Confirmation: ETT inserted through vocal cords under direct vision, positive ETCO2 and breath sounds checked- equal and bilateral Secured at: 20 cm Tube secured with: Tape Dental Injury: Teeth and Oropharynx as per pre-operative assessment

## 2023-05-17 NOTE — Op Note (Signed)
 Preoperative diagnosis: Cholelithiasis   Postoperative diagnosis: Same  Procedure: Robotic Assisted Laparoscopic Cholecystectomy.   Anesthesia: GETA   Surgeon: Dr. Hazle Quant  Wound Classification: Clean Contaminated  Indications: Patient is a 63 y.o. female developed right upper quadrant pain, nausea and on workup was found to have cholelithiasis with a normal common duct. Robotic Assisted Laparoscopic cholecystectomy was elected.  Findings: Cirrhotic liver Critical view of safety achieved Cystic duct and artery identified, ligated and divided Adequate hemostasis       Description of procedure: The patient was placed on the operating table in the supine position. General anesthesia was induced. A time-out was completed verifying correct patient, procedure, site, positioning, and implant(s) and/or special equipment prior to beginning this procedure. An orogastric tube was placed. The abdomen was prepped and draped in the usual sterile fashion.  An incision was made in a natural skin line below the umbilicus.  The fascia was elevated and the Veress needle inserted. Proper position was confirmed by aspiration and saline meniscus test.  The abdomen was insufflated with carbon dioxide to a pressure of 15 mmHg. The patient tolerated insufflation well. A 8-mm trocar was then inserted in optiview fashion.  The laparoscope was inserted and the abdomen inspected. No injuries from initial trocar placement were noted. Additional trocars were then inserted in the following locations: an 8-mm trocar in the left lateral abdomen, and another two 8-mm trocars to the right side of the abdomen 5 cm appart. The umbilical trocar was changed to a 12 mm trocar all under direct visualization. The abdomen was inspected and no abnormalities were found. The table was placed in the reverse Trendelenburg position with the right side up. The robotic arms were docked and target anatomy identified. Instrument  inserted under direct visualization.  Filmy adhesions between the gallbladder and omentum, duodenum and transverse colon were lysed with electrocautery. The dome of the gallbladder was grasped with a prograsp and retracted over the dome of the liver. The infundibulum was also grasped with an atraumatic grasper and retracted toward the right lower quadrant. This maneuver exposed Calot's triangle. The peritoneum overlying the gallbladder infundibulum was then incised and the cystic duct and cystic artery identified and circumferentially dissected. Critical view of safety reviewed before ligating any structure. Firefly images taken to visualize biliary ducts. The cystic duct and cystic artery were then doubly clipped and divided close to the gallbladder.  The gallbladder was then dissected from its peritoneal attachments by electrocautery. Hemostasis was checked and the gallbladder and contained stones were removed using an endoscopic retrieval bag. The gallbladder was passed off the table as a specimen.  There was no evidence of bleeding from the gallbladder fossa or cystic artery or leakage of the bile from the cystic duct stump. Secondary trocars were removed under direct vision. No bleeding was noted. The robotic arms were undoked. The scope was withdrawn and the umbilical trocar removed. The abdomen was allowed to collapse. The fascia of the 12mm trocar sites was closed with figure-of-eight 0 vicryl sutures. The skin was closed with subcuticular sutures of 4-0 monocryl and topical skin adhesive. The orogastric tube was removed.  The patient tolerated the procedure well and was taken to the postanesthesia care unit in stable condition.   Specimen: Gallbladder  Complications: None  EBL: 5 mL

## 2023-05-18 LAB — SURGICAL PATHOLOGY

## 2023-05-24 ENCOUNTER — Other Ambulatory Visit: Payer: Self-pay | Admitting: Cardiovascular Disease

## 2023-05-24 ENCOUNTER — Telehealth: Payer: Self-pay

## 2023-05-24 NOTE — Telephone Encounter (Signed)
 Left a detailed message on the patient's voice mail to contact our office for a follow up appointment before any further refills given. Told the patient if she is unable to come to our office, to be sure and contact her PCP for a refill on the medication requested (Zetia).

## 2023-07-02 ENCOUNTER — Other Ambulatory Visit: Payer: Self-pay | Admitting: Cardiovascular Disease

## 2024-01-21 IMAGING — MG MM DIGITAL SCREENING BILAT W/ TOMO AND CAD
8 of 14 series · 8 of 40 positions shown · non-contrast
Comparison: Previous exam(s).

ACR Breast Density Category a: The breast tissue is almost entirely
fatty.

CLINICAL DATA: Screening.

EXAM:
DIGITAL SCREENING BILATERAL MAMMOGRAM WITH TOMOSYNTHESIS AND CAD
TECHNIQUE: Bilateral screening digital craniocaudal and mediolateral oblique
mammograms were obtained. Bilateral screening digital breast
tomosynthesis was performed. The images were evaluated with
computer-aided detection.

[L MLO synth-2D (1 of 2)]
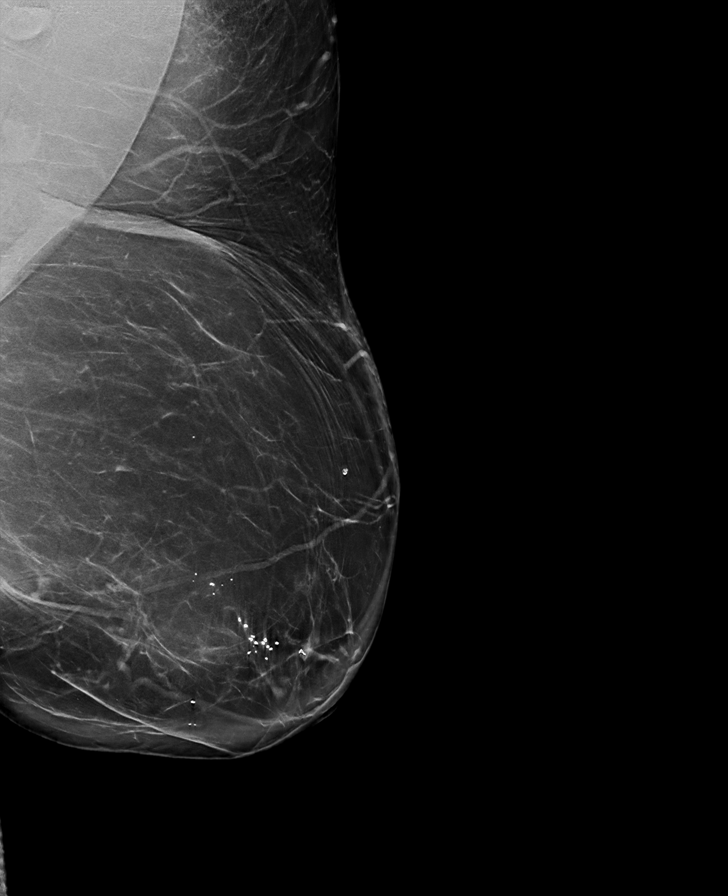

[R MLO synth-2D (1 of 2)]
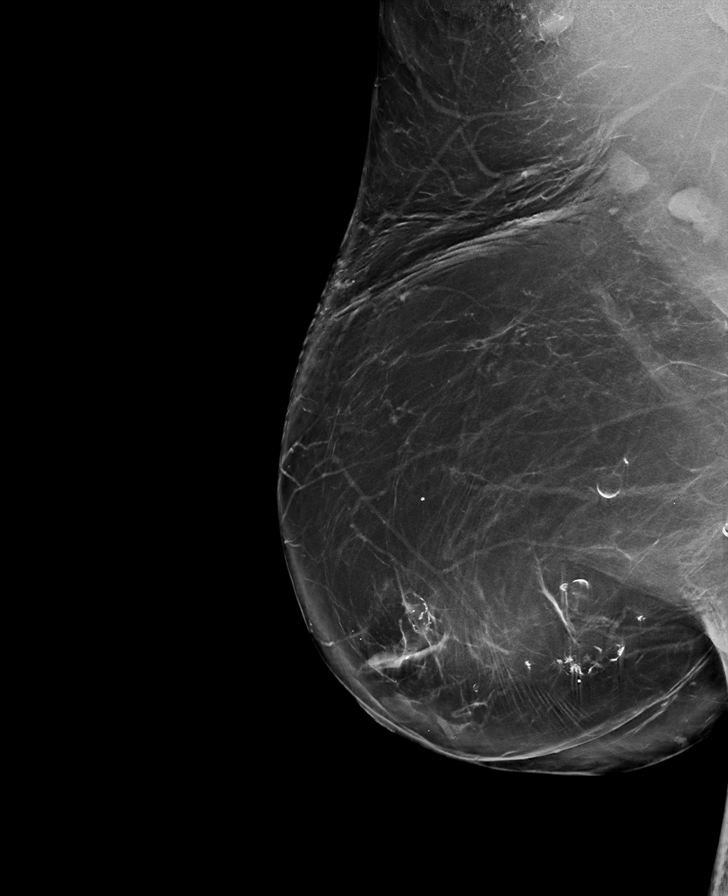

[L MLO synth-2D (2 of 2)]
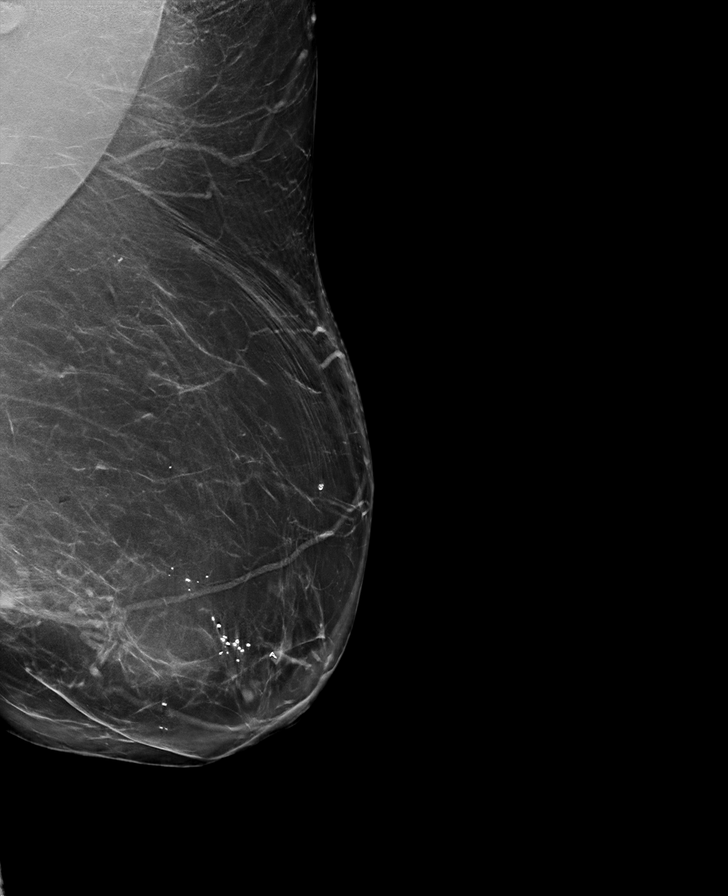

[R CC synth-2D]
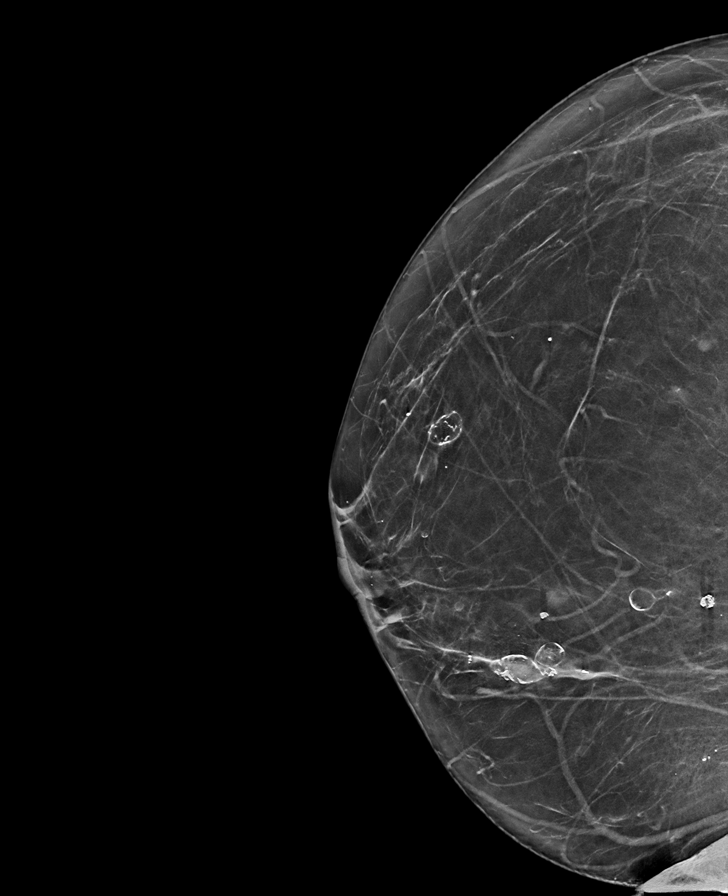

[L CC synth-2D]
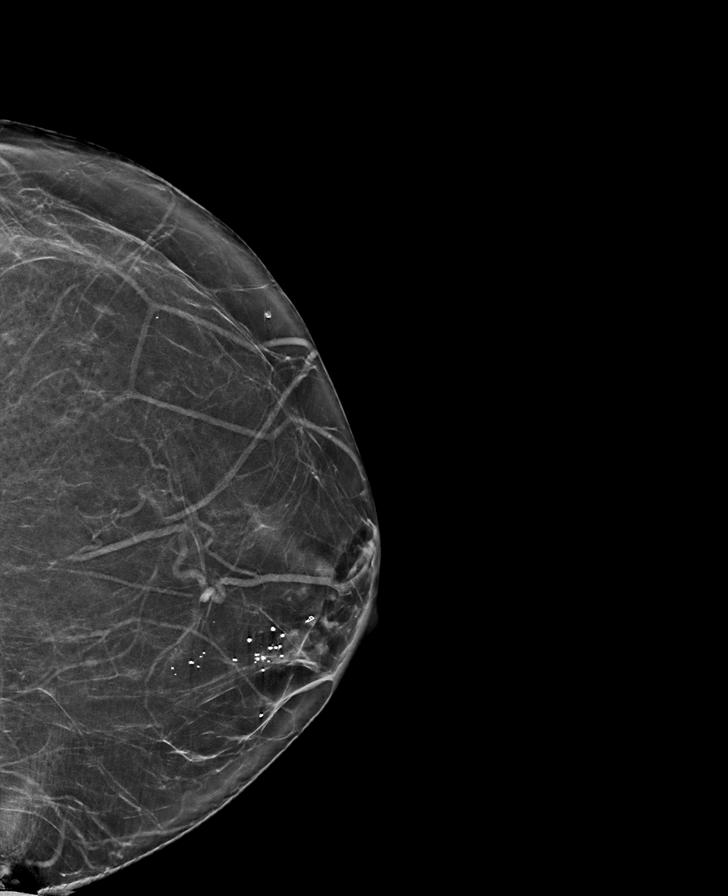

[R CV synth-2D]
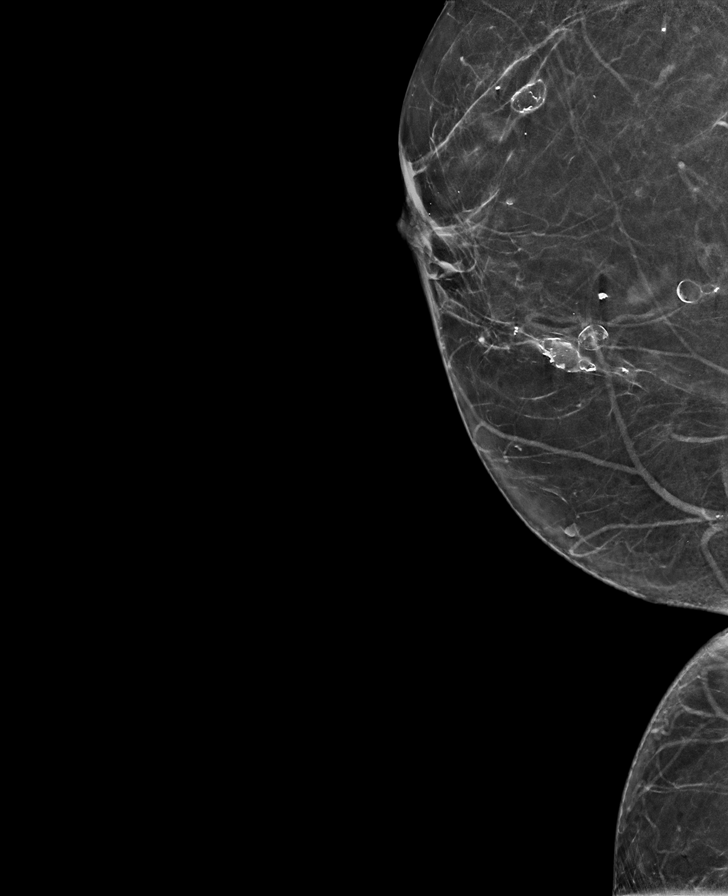

[R MLO synth-2D (2 of 2)]
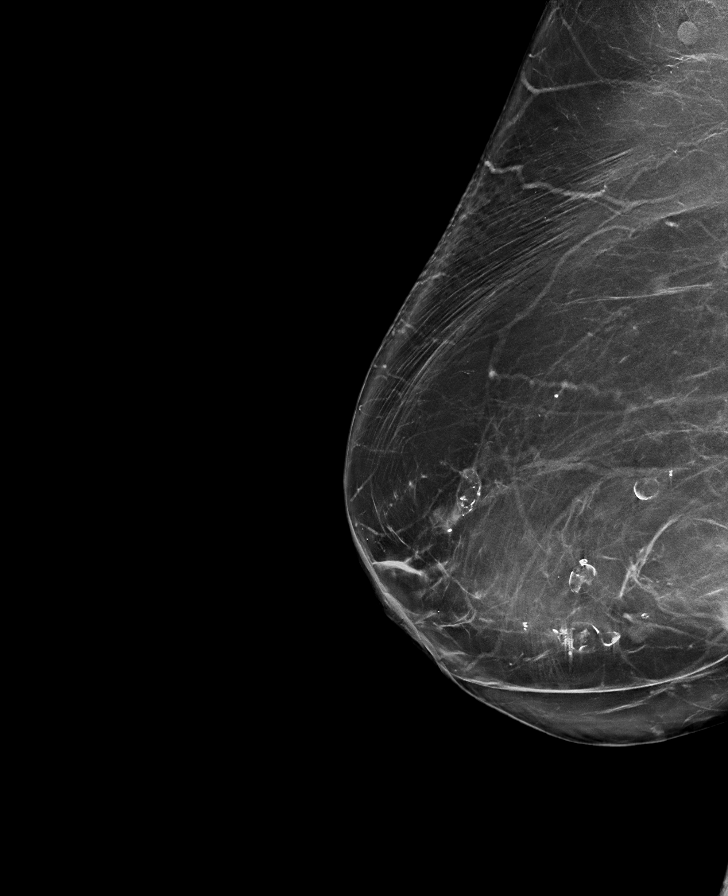

[L MLO tomo · tomo slice 49/96.0]
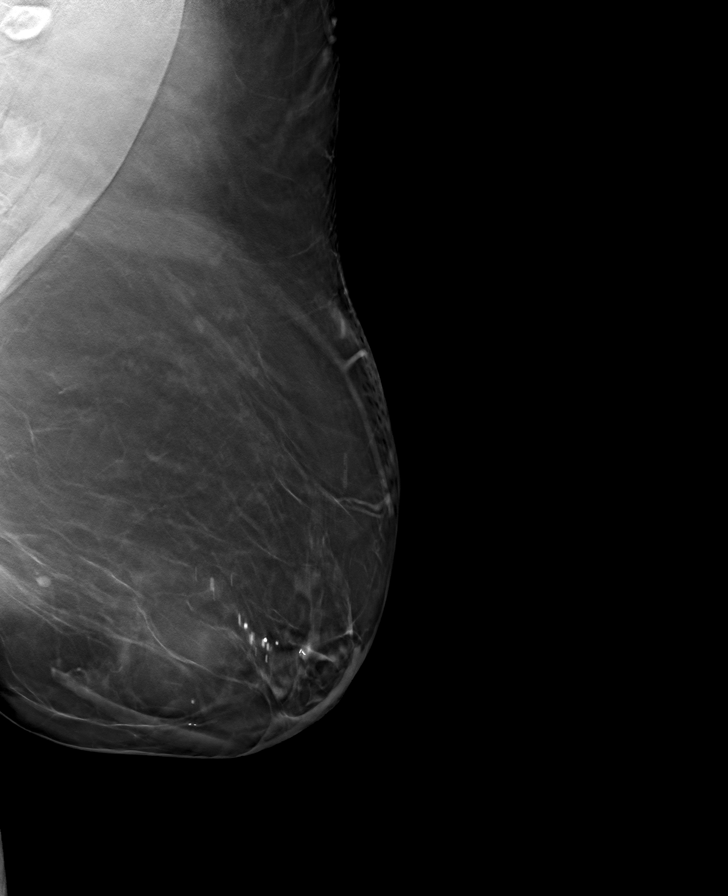

[8 of 40 positions shown; findings below may reference images not displayed]

FINDINGS: There are no findings suspicious for malignancy.
IMPRESSION: No mammographic evidence of malignancy. A result letter of this
screening mammogram will be mailed directly to the patient.

RECOMMENDATION:
Screening mammogram in one year. (Code:0E-3-N98)

BI-RADS CATEGORY  1: Negative.
